# Patient Record
Sex: Female | Born: 1995 | Race: White | Hispanic: No
Health system: Southern US, Community
[De-identification: ages and names within clinical notes are randomized; demographics above are authoritative.]

## PROBLEM LIST (undated history)

## (undated) ENCOUNTER — Inpatient Hospital Stay (HOSPITAL_COMMUNITY): Payer: Self-pay

## (undated) DIAGNOSIS — B1920 Unspecified viral hepatitis C without hepatic coma: Secondary | ICD-10-CM

## (undated) DIAGNOSIS — F1190 Opioid use, unspecified, uncomplicated: Secondary | ICD-10-CM

---
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
## 2016-08-31 ENCOUNTER — Encounter (HOSPITAL_BASED_OUTPATIENT_CLINIC_OR_DEPARTMENT_OTHER): Payer: Self-pay | Admitting: *Deleted

## 2016-08-31 ENCOUNTER — Emergency Department (HOSPITAL_BASED_OUTPATIENT_CLINIC_OR_DEPARTMENT_OTHER)
Admission: EM | Admit: 2016-08-31 | Discharge: 2016-08-31 | Discharge status: Against Medical Advice | Payer: Medicaid Other | Attending: Emergency Medicine | Admitting: Emergency Medicine

## 2016-08-31 DIAGNOSIS — Z5329 Procedure and treatment not carried out because of patient's decision for other reasons: Secondary | ICD-10-CM | POA: Insufficient documentation

## 2016-08-31 DIAGNOSIS — L02414 Cutaneous abscess of left upper limb: Secondary | ICD-10-CM | POA: Insufficient documentation

## 2016-08-31 DIAGNOSIS — L03113 Cellulitis of right upper limb: Secondary | ICD-10-CM | POA: Insufficient documentation

## 2016-08-31 DIAGNOSIS — F1990 Other psychoactive substance use, unspecified, uncomplicated: Secondary | ICD-10-CM

## 2016-08-31 NOTE — ED Notes (Signed)
Patient claimed that she has been a heroine addict for 6 years but she started going to Methadone clinic for 2 days.   She's been on 35 mg of Liquid Methadone.    She stated that she was taking Bactrim a week ago but only took 2 tabs out of the prescription.  She left her med at her friend's home and he refused to give it to her.  She has also been taking Clindamycin, her mom's med.

## 2016-08-31 NOTE — ED Notes (Signed)
ED Provider at bedside. 

## 2016-08-31 NOTE — ED Provider Notes (Signed)
[facility] Provider Note   CSN: [id number] Arrival date & time: 08/31/16  2201  By signing my name below, I, Rosana Fret, attest that this documentation has been prepared under the direction and in the presence of Arthor Captain, PA-C.  Electronically Signed: Rosana Fret, ED Scribe. 08/31/16. 11:51 PM.  History   Chief Complaint Chief Complaint  Patient presents with  . Abscess   The history is provided by the patient. No language interpreter was used.   HPI Comments: Rita Trujillo is a 21 y.o. female who presents to the Emergency Department complaining of a moderate, gradually worsening area of pain and swelling to the left forearm onset 3-4 days ago. Pt has a hx of similar symptoms and started a course of Bactrim for 2 days. She stopped taking it for a week and just took Clindamycin today. Pt states pain is exacerbated with palpation and direct pressure. Pt has a hx of IV drug use. Her last use was 48 hours ago and she went to a rehab clinic today. Denies fever, chills, drainage from the area.   Past Medical History:  Diagnosis Date  . Hepatitis C     Patient Active Problem List   Diagnosis Date Noted  . NSVD (normal spontaneous vaginal delivery) 04/20/2014  . Active labor 04/19/2014  . Heroin dependence (HCC) 10/15/2012    History reviewed. No pertinent surgical history.  OB History    Gravida Para Term Preterm AB Living   1 1 0 1 0 1   SAB TAB Ectopic Multiple Live Births   0 0 0 0 1       Home Medications    Prior to Admission medications   Not on File    Family History History reviewed. No pertinent family history.  Social History Social History  Substance Use Topics  . Smoking status: Current Every Day Smoker  . Smokeless tobacco: Not on file  . Alcohol use No     Allergies   Patient has no known allergies.   Review of Systems Review of Systems All other systems reviewed and are negative for acute change except as noted in  the HPI.  Physical Exam Updated Vital Signs BP 107/71 (BP Location: Right Arm)   Pulse 95   Temp 99.1 F (37.3 C) (Oral)   Resp 18   Ht 5\' 5"  (1.651 m)   Wt 54.4 kg (120 lb)   LMP 07/11/2016   SpO2 98%   BMI 19.97 kg/m   Physical Exam  Constitutional: She is oriented to person, place, and time. She appears well-developed and well-nourished. No distress.  HENT:  Head: Normocephalic and atraumatic.  Eyes: EOM are normal.  Neck: Normal range of motion.  Cardiovascular: Normal rate, regular rhythm, normal heart sounds and intact distal pulses.  Exam reveals no gallop and no friction rub.   No murmur heard. Pulmonary/Chest: Effort normal and breath sounds normal.  Abdominal: Soft. She exhibits no distension. There is no tenderness.  Musculoskeletal: Normal range of motion.  Neurological: She is alert and oriented to person, place, and time.  Skin: Skin is warm and dry. There is erythema.  Left forearm 4 cm abscess area of fluctuance with crusting. 20 cm of surrounding erythema.   Psychiatric: She has a normal mood and affect. Judgment normal.  Nursing note and vitals reviewed.    ED Treatments / Results  DIAGNOSTIC STUDIES: Oxygen Saturation is 98% on RA, normal by my interpretation.   COORDINATION OF CARE: 10:42 PM-Discussed  next steps with pt including I&D. Pt verbalized understanding and is agreeable with the plan.   Labs (all labs ordered are listed, but only abnormal results are displayed) Labs Reviewed - No data to display  EKG  EKG Interpretation None       Radiology No results found.  Procedures Procedures (including critical care time)  Medications Ordered in ED Medications - No data to display   Initial Impression / Assessment and Plan / ED Course  I have reviewed the triage vital signs and the nursing notes.  Pertinent labs & imaging results that were available during my care of the patient were reviewed by me and considered in my medical  decision making (see chart for details).        I discussed plan of care with the patient which included incision and drainage. The patient eloped prior to intervention. I was not able to discuss risks of leaving AMA.  Final Clinical Impressions(s) / ED Diagnoses   Final diagnoses:  Abscess of left forearm  Right forearm cellulitis  IVDU (intravenous drug user)    New Prescriptions There are no discharge medications for this patient.  I personally performed the services described in this documentation, which was scribed in my presence. The recorded information has been reviewed and is accurate.    Arthor CaptainHarris, Elisabet Gutzmer, PA-C 09/02/16 2136    Maia PlanLong, Joshua G, MD 09/03/16 [phone]

## 2016-08-31 NOTE — ED Notes (Signed)
Patient stated that she has to go out to smoke.  Informed her that this is a smoke free facility but she insisted.  Her mother went out with her.

## 2016-08-31 NOTE — ED Notes (Signed)
Pt not found in room , [facility] scanned parking lot and no pt found

## 2016-08-31 NOTE — ED Triage Notes (Signed)
Abscess on left arm.  IV drug user, hx of same.

## 2020-02-27 NOTE — L&D Delivery Note (Signed)
OB/GYN Faculty Practice Delivery Note  Rita Trujillo is a 25 y.o. G2P0101 s/p NSVD at [redacted]w[redacted]d. She was admitted for active labor.   ROM: unknown GBS Status: negative   Maximum Maternal Temperature: 98.7 F   Labor Progress: Patient arrived to the MAU at 9 cm dilation and rapidly progressed to complete and pushing.   Delivery Date/Time: 12/08/2020 at 2042 Delivery: Patient arrived to MAU 9 cm dilated and I accompanied her up to L&D. On arrival to the room patient was pushing involuntarily and fetal heart rate had dropped into the 70's. After several pushes head delivered in ROA position with compound hand presentation. No nuchal cord present. Shoulder and body delivered in usual fashion. Infant with spontaneous cry, placed on mother's abdomen, dried and stimulated. Cord clamped x 2 after 1-minute delay, and cut by FOB. Cord blood drawn. At this point [facility] CNM Holshouser arrived and assumed care of the patient.   Infant: Female  APGAR (1 MIN): 8   APGAR (5 MINS): 9    Weight: pending  Venora Maples, MD/MPH Attending Family Medicine Physician, Faculty Practice [facility], [facility]

## 2020-02-27 NOTE — L&D Delivery Note (Signed)
Delivery Note Labor onset: 12/08/2020  Labor Onset Time: 2000 Complete dilation at 8:30 PM  Onset of pushing at 2040 FHR second stage Cat 2 Analgesia/Anesthesia intrapartum: N/A  Guided pushing with maternal urge. Delivery of a viable female at 2042. Fetal head delivered in ROA position. Delivered by MAU faculty practice before I arrived to room.   Nuchal cord: N/A.  Infant placed on maternal abd, dried, and tactile stim.  Cord double clamped after 1 minute and cut by FOB.  3 Rns present for birth.  Cord blood sample collected: yes Arterial cord blood sample collected: N/A  Placenta delivered Tomasa Blase via Binnie Kand Maneuver, intact, with 3 VC.  Placenta to L&D. Uterine tone firm, bleeding minimal  Right labial laceration identified.  Anesthesia: lidocaine 1% Repair: 3-0 vicryl  QBL/EBL (mL): 100 Complications: N/A APGAR: APGAR (1 MIN): 8   APGAR (5 MINS): 9   APGAR (10 MINS):   Mom to postpartum.  Baby to Couplet care / Skin to Skin.  Gerhard Munch Pat Elicker MSN, CNM 12/08/2020, 9:46 PM

## 2020-06-09 LAB — OB RESULTS CONSOLE HIV ANTIBODY (ROUTINE TESTING): HIV: NONREACTIVE

## 2020-06-09 LAB — OB RESULTS CONSOLE RPR: RPR: NONREACTIVE

## 2020-06-09 LAB — OB RESULTS CONSOLE HEPATITIS B SURFACE ANTIGEN: Hepatitis B Surface Ag: NEGATIVE

## 2020-06-09 LAB — OB RESULTS CONSOLE RUBELLA ANTIBODY, IGM: Rubella: IMMUNE

## 2020-06-16 DIAGNOSIS — B182 Chronic viral hepatitis C: Secondary | ICD-10-CM | POA: Insufficient documentation

## 2020-06-18 ENCOUNTER — Emergency Department (HOSPITAL_COMMUNITY)
Admission: EM | Admit: 2020-06-18 | Discharge: 2020-06-18 | Disposition: A | Discharge status: Home / Self Care | Payer: Medicaid Other | Attending: Emergency Medicine | Admitting: Emergency Medicine

## 2020-06-18 ENCOUNTER — Encounter (HOSPITAL_COMMUNITY): Payer: Self-pay | Admitting: Obstetrics and Gynecology

## 2020-06-18 ENCOUNTER — Other Ambulatory Visit: Payer: Self-pay

## 2020-06-18 ENCOUNTER — Inpatient Hospital Stay (HOSPITAL_COMMUNITY)
Admission: AD | Admit: 2020-06-18 | Discharge: 2020-06-18 | Disposition: A | Discharge status: Home / Self Care | Payer: Medicaid Other | Attending: Obstetrics and Gynecology | Admitting: Obstetrics and Gynecology

## 2020-06-18 DIAGNOSIS — F1990 Other psychoactive substance use, unspecified, uncomplicated: Secondary | ICD-10-CM

## 2020-06-18 DIAGNOSIS — Z3A14 14 weeks gestation of pregnancy: Secondary | ICD-10-CM | POA: Diagnosis not present

## 2020-06-18 DIAGNOSIS — F17200 Nicotine dependence, unspecified, uncomplicated: Secondary | ICD-10-CM | POA: Insufficient documentation

## 2020-06-18 DIAGNOSIS — O99322 Drug use complicating pregnancy, second trimester: Secondary | ICD-10-CM | POA: Diagnosis present

## 2020-06-18 DIAGNOSIS — Z79899 Other long term (current) drug therapy: Secondary | ICD-10-CM

## 2020-06-18 DIAGNOSIS — F1120 Opioid dependence, uncomplicated: Secondary | ICD-10-CM

## 2020-06-18 DIAGNOSIS — Z3492 Encounter for supervision of normal pregnancy, unspecified, second trimester: Secondary | ICD-10-CM

## 2020-06-18 DIAGNOSIS — F1999 Other psychoactive substance use, unspecified with unspecified psychoactive substance-induced disorder: Secondary | ICD-10-CM | POA: Insufficient documentation

## 2020-06-18 MED ORDER — METHADONE HCL 10 MG PO TABS
60.0000 mg | ORAL_TABLET | Freq: Once | ORAL | Status: AC
Start: 2020-06-18 — End: 2020-06-18
  Administered 2020-06-18: 60 mg via ORAL
  Filled 2020-06-18: qty 6

## 2020-06-18 NOTE — ED Provider Notes (Addendum)
[facility] EMERGENCY DEPARTMENT Provider Note   CSN: [id number] Arrival date & time: 06/18/20  0930     History Chief complaint: Missed methadone dose  Rita Trujillo is a 25 y.o. female.  HPI    The patient is on chronic methadone for history of heroin dependence.  Patient is also currently [redacted] weeks pregnant.  Patient states she is new to the methadone treatment center that she is using now.  She thought they closed at 830 this morning but they closed at 730.  She states she arrived late and they had already closed shut down.  They instructed her to go to [facility] emergency room where she could potentially receive a courtesy dose.  Patient denies any other complaints. Patient went to the maternity admission unit this morning.  They instructed the patient to come down to the emergency department   Past Medical History:  Diagnosis Date  . Hepatitis C     Patient Active Problem List   Diagnosis Date Noted  . NSVD (normal spontaneous vaginal delivery) 04/20/2014  . Active labor 04/19/2014  . Heroin dependence (HCC) 10/15/2012    No past surgical history on file.   OB History    Gravida  2   Para  1   Term  0   Preterm  1   AB  0   Living  1     SAB  0   IAB  0   Ectopic  0   Multiple  0   Live Births  1           No family history on file.  Social History   Tobacco Use  . Smoking status: Current Every Day Smoker  Substance Use Topics  . Alcohol use: No  . Drug use: Yes    Types: IV, Cocaine    Comment: heroin, cocaine    Home Medications Prior to Admission medications   Not on File    Allergies    Amoxicillin and Penicillins  Review of Systems   Review of Systems  All other systems reviewed and are negative.   Physical Exam Updated Vital Signs BP 125/68 (BP Location: Right Arm)   Pulse 78   Temp 98 F (36.7 C) (Oral)   Resp 16   SpO2 99%   Physical Exam Vitals and nursing note reviewed.   Constitutional:      General: She is not in acute distress.    Appearance: She is well-developed.  HENT:     Head: Normocephalic and atraumatic.     Right Ear: External ear normal.     Left Ear: External ear normal.  Eyes:     General: No scleral icterus.       Right eye: No discharge.        Left eye: No discharge.     Conjunctiva/sclera: Conjunctivae normal.  Neck:     Trachea: No tracheal deviation.  Cardiovascular:     Rate and Rhythm: Normal rate.  Pulmonary:     Effort: Pulmonary effort is normal. No respiratory distress.     Breath sounds: No stridor.  Abdominal:     General: There is no distension.  Musculoskeletal:        General: No swelling or deformity.     Cervical back: Neck supple.  Skin:    General: Skin is warm and dry.     Findings: No rash.  Neurological:     Mental Status: She is alert.  Cranial Nerves: Cranial nerve deficit: no gross deficits.     ED Results / Procedures / Treatments   Labs (all labs ordered are listed, but only abnormal results are displayed) Labs Reviewed - No data to display  EKG None  Radiology No results found.  Procedures Procedures   Medications Ordered in ED Medications  methadone (DOLOPHINE) tablet 60 mg (has no administration in time range)    ED Course  I have reviewed the triage vital signs and the nursing notes.  Pertinent labs & imaging results that were available during my care of the patient were reviewed by me and considered in my medical decision making (see chart for details).  Clinical Course as of 06/18/20 1016  Sat Jun 18, 2020  0947 Spoke with [facility] tx center.  Pt is a confirmed patient.   [JK]    Clinical Course User Index [JK] Linwood Dibbles, MD   MDM Rules/Calculators/A&P                          Patient is a confirmed patient at a methadone treatment center.  Patient states she misunderstood the time that she had to be there by and was late.  I spoke to the treatment center  and they did confirm that the patient did arrive today.  She unfortunately was there passed and there confirmed hours.  Patient is normally on the weekends if would be given 1 dose of her methadone medication while she was there and they would give her 1 dose to take on Sunday.  Patient's confirm dose is 60 mg.  That is what she received on April 22.  Patient will be given 1 dose of methadone here.  I do think it is reasonable for her to return tomorrow for second dose Final Clinical Impression(s) / ED Diagnoses Final diagnoses:  Medication management    Rx / DC Orders ED Discharge Orders    None       Linwood Dibbles, MD 06/18/20 1016  PE addendum    Linwood Dibbles, MD 06/27/20 1140

## 2020-06-18 NOTE — Progress Notes (Signed)
Report called to ED Charge RN regarding pt transfer.  Accepting MD Gwenlyn Fudge.

## 2020-06-18 NOTE — ED Triage Notes (Signed)
Pt normally goes to [facility] tx center, but did not make it by the time they closed but was advised that if she came to the ED we could courtesy dose her for today and tomorrow while they are closed since she is [redacted] weeks pregnant.

## 2020-06-18 NOTE — MAU Note (Signed)
Presents to receive Methadone treattment. Denies abdominal pain/cramping and VB.

## 2020-06-18 NOTE — Discharge Instructions (Signed)

## 2020-06-18 NOTE — MAU Provider Note (Signed)
Event Date/Time   First Provider Initiated Contact with Patient 06/18/20 0915      S Ms. Rita Trujillo is a 25 y.o. G2P0101 patient who presents to MAU today requesting her methadone treatment. She missed her am dose and the clinic is closed tomorrow. She was told by clinic staff if she came to the hospital, we will treat her. She denies any abdominal pain, vaginal bleeding or leaking of fluid.    O BP (!) 116/55 (BP Location: Right Arm)   Pulse 80   Temp 98.2 F (36.8 C) (Oral)   Resp 18   Ht 5\' 5"  (1.651 m)   Wt 70.6 kg   SpO2 99%   BMI 25.89 kg/m  Physical Exam Vitals and nursing note reviewed.  Constitutional:      General: She is not in acute distress.    Appearance: She is well-developed.  HENT:     Head: Normocephalic.  Cardiovascular:     Rate and Rhythm: Normal rate.  Pulmonary:     Effort: Pulmonary effort is normal. No respiratory distress.  Skin:    General: Skin is warm and dry.  Neurological:     Mental Status: She is alert and oriented to person, place, and time.  Psychiatric:        Behavior: Behavior normal.        Thought Content: Thought content normal.        Judgment: Judgment normal.    FHT: 159 bpm  Patient without OB complaints, normal FHT noted.   CNM for [facility] consulted- states OB office did not inform patient to come to MAU for treatment and will follow MAU protocol for this.  Dr. in agreement that MAU does not offer methadone treatment  Dr. Donavan Foil at [facility] consulted- states if pharmacy has option to dispense, the ED will give there. Main pharmacy confirms ability to dispense medication. Patient to be transferred to [facility] to receive treatment. Patient verbalized understanding.    A Medical screening exam complete 1. Normal intrauterine pregnancy on prenatal ultrasound in second trimester   2. [redacted] weeks gestation of pregnancy   3. Methadone maintenance treatment affecting pregnancy in second trimester (HCC)      P Discharge  from MAU in stable condition List of options for follow-up given  Warning signs for worsening condition that would warrant emergency follow-up discussed Patient may return to MAU as needed   [facility], Rolm Bookbinder 06/18/2020 9:15 AM

## 2020-06-19 ENCOUNTER — Encounter (HOSPITAL_COMMUNITY): Payer: Self-pay | Admitting: Emergency Medicine

## 2020-06-19 ENCOUNTER — Emergency Department (HOSPITAL_COMMUNITY)
Admission: EM | Admit: 2020-06-19 | Discharge: 2020-06-19 | Disposition: A | Discharge status: Home / Self Care | Payer: Medicaid Other | Attending: Emergency Medicine | Admitting: Emergency Medicine

## 2020-06-19 DIAGNOSIS — Z5181 Encounter for therapeutic drug level monitoring: Secondary | ICD-10-CM | POA: Insufficient documentation

## 2020-06-19 DIAGNOSIS — Z3A14 14 weeks gestation of pregnancy: Secondary | ICD-10-CM | POA: Diagnosis not present

## 2020-06-19 DIAGNOSIS — Z79899 Other long term (current) drug therapy: Secondary | ICD-10-CM

## 2020-06-19 DIAGNOSIS — F17200 Nicotine dependence, unspecified, uncomplicated: Secondary | ICD-10-CM | POA: Insufficient documentation

## 2020-06-19 DIAGNOSIS — O99322 Drug use complicating pregnancy, second trimester: Secondary | ICD-10-CM | POA: Diagnosis not present

## 2020-06-19 DIAGNOSIS — F1990 Other psychoactive substance use, unspecified, uncomplicated: Secondary | ICD-10-CM | POA: Diagnosis not present

## 2020-06-19 DIAGNOSIS — F1120 Opioid dependence, uncomplicated: Secondary | ICD-10-CM | POA: Diagnosis not present

## 2020-06-19 MED ORDER — METHADONE HCL 10 MG/ML PO CONC
60.0000 mg | Freq: Once | ORAL | Status: AC
  Administered 2020-06-19: 60 mg via ORAL
  Filled 2020-06-19 (×2): qty 6

## 2020-06-19 NOTE — ED Provider Notes (Signed)
[facility] EMERGENCY DEPARTMENT Provider Note   CSN: [id number] Arrival date & time: 06/19/20  1121     History Chief Complaint  Patient presents with  . Methadone    Rita Trujillo is a 25 y.o. female.  HPI   Patient has a history of heroin use and is currently on chronic methadone treatment.  Patient is [redacted] weeks pregnant.  Patient was seen by me yesterday as well.  She went to a new treatment center yesterday morning.  She thought the treatment center close at 830 but they closed at 730.  Patient ended up arriving late and she was not able to receive her dose.  Apparently staff are still there but were not administering medications.  They told her to come to the emergency room for a courtesy dose of methadone.  I did speak with the methadone treatment center yesterday and they confirmed that the patient is a patient of theirs.  They corroborated all the details that she provided.  They indicated she received 60 mg daily and normally on Saturday she would have been given an additional dose to cover her on Sunday.  Patient denies any changes from yesterday.  She is feeling well.  She is requesting her Sunday dose medication.  Past Medical History:  Diagnosis Date  . Hepatitis C     Patient Active Problem List   Diagnosis Date Noted  . NSVD (normal spontaneous vaginal delivery) 04/20/2014  . Active labor 04/19/2014  . Heroin dependence (HCC) 10/15/2012    History reviewed. No pertinent surgical history.   OB History    Gravida  2   Para  1   Term  0   Preterm  1   AB  0   Living  1     SAB  0   IAB  0   Ectopic  0   Multiple  0   Live Births  1           No family history on file.  Social History   Tobacco Use  . Smoking status: Current Every Day Smoker  Substance Use Topics  . Alcohol use: No  . Drug use: Yes    Types: IV, Cocaine    Comment: heroin, cocaine    Home Medications Prior to Admission medications   Not on  File    Allergies    Amoxicillin and Penicillins  Review of Systems   Review of Systems  All other systems reviewed and are negative.   Physical Exam Updated Vital Signs BP 121/66 (BP Location: Right Arm)   Pulse (!) 101   Temp 97.8 F (36.6 C) (Oral)   Resp 18   SpO2 99%   Physical Exam Vitals and nursing note reviewed.  Constitutional:      General: She is not in acute distress.    Appearance: She is well-developed.  HENT:     Head: Normocephalic and atraumatic.     Right Ear: External ear normal.     Left Ear: External ear normal.  Eyes:     General: No scleral icterus.       Right eye: No discharge.        Left eye: No discharge.     Conjunctiva/sclera: Conjunctivae normal.  Neck:     Trachea: No tracheal deviation.  Cardiovascular:     Rate and Rhythm: Normal rate.  Pulmonary:     Effort: Pulmonary effort is normal. No respiratory distress.  Breath sounds: No stridor.  Abdominal:     General: There is no distension.  Musculoskeletal:        General: No swelling or deformity.     Cervical back: Neck supple.  Skin:    General: Skin is warm and dry.     Findings: No rash.  Neurological:     Mental Status: She is alert.     Cranial Nerves: Cranial nerve deficit: no gross deficits.     ED Results / Procedures / Treatments   Labs (all labs ordered are listed, but only abnormal results are displayed) Labs Reviewed - No data to display  EKG None  Radiology No results found.  Procedures Procedures   Medications Ordered in ED Medications  methadone (DOLOPHINE) 10 MG/ML solution 60 mg (has no administration in time range)    ED Course  I have reviewed the triage vital signs and the nursing notes.  Pertinent labs & imaging results that were available during my care of the patient were reviewed by me and considered in my medical decision making (see chart for details).    MDM Rules/Calculators/A&P                          Patient will be  given methadone.  Explained to the patient that we do not do this typically in the emergency department and likely will not be able to in the future.  Patient understands.  She will follow-up with her methadone clinic tomorrow Final Clinical Impression(s) / ED Diagnoses Final diagnoses:  Medication management    Rx / DC Orders ED Discharge Orders    None       Linwood Dibbles, MD 06/19/20 1147

## 2020-06-19 NOTE — Discharge Instructions (Addendum)
Follow up at your treatment center as planned

## 2020-06-19 NOTE — ED Triage Notes (Addendum)
Pt states she came to ED yesterday for a "courtesy dose" of methadone and was told she could return today to receive another dose.  [redacted] weeks pregnant.

## 2020-06-23 ENCOUNTER — Other Ambulatory Visit: Payer: Medicaid Other

## 2020-06-23 ENCOUNTER — Ambulatory Visit: Payer: Medicaid Other

## 2020-06-23 ENCOUNTER — Other Ambulatory Visit: Payer: Self-pay | Admitting: Obstetrics

## 2020-06-23 DIAGNOSIS — Z363 Encounter for antenatal screening for malformations: Secondary | ICD-10-CM

## 2020-08-09 ENCOUNTER — Ambulatory Visit: Payer: Medicaid Other

## 2020-08-09 ENCOUNTER — Other Ambulatory Visit: Payer: Medicaid Other

## 2020-08-09 ENCOUNTER — Ambulatory Visit: Payer: Medicaid Other | Attending: Obstetrics and Gynecology

## 2020-10-24 ENCOUNTER — Other Ambulatory Visit: Payer: Self-pay

## 2020-11-10 ENCOUNTER — Encounter (HOSPITAL_COMMUNITY): Payer: Self-pay | Admitting: Emergency Medicine

## 2020-11-10 ENCOUNTER — Ambulatory Visit: Payer: Medicaid Other | Attending: Obstetrics and Gynecology

## 2020-11-10 ENCOUNTER — Other Ambulatory Visit: Payer: Self-pay

## 2020-11-10 ENCOUNTER — Ambulatory Visit (HOSPITAL_BASED_OUTPATIENT_CLINIC_OR_DEPARTMENT_OTHER): Payer: Medicaid Other | Admitting: Obstetrics

## 2020-11-10 ENCOUNTER — Ambulatory Visit: Payer: Medicaid Other | Admitting: *Deleted

## 2020-11-10 ENCOUNTER — Other Ambulatory Visit: Payer: Self-pay | Admitting: *Deleted

## 2020-11-10 VITALS — BP 131/83 | HR 71

## 2020-11-10 DIAGNOSIS — F1910 Other psychoactive substance abuse, uncomplicated: Secondary | ICD-10-CM | POA: Insufficient documentation

## 2020-11-10 DIAGNOSIS — O98419 Viral hepatitis complicating pregnancy, unspecified trimester: Secondary | ICD-10-CM | POA: Diagnosis not present

## 2020-11-10 DIAGNOSIS — Z3A35 35 weeks gestation of pregnancy: Secondary | ICD-10-CM

## 2020-11-10 DIAGNOSIS — B182 Chronic viral hepatitis C: Secondary | ICD-10-CM | POA: Diagnosis not present

## 2020-11-10 DIAGNOSIS — O09293 Supervision of pregnancy with other poor reproductive or obstetric history, third trimester: Secondary | ICD-10-CM | POA: Diagnosis not present

## 2020-11-10 DIAGNOSIS — O99323 Drug use complicating pregnancy, third trimester: Secondary | ICD-10-CM | POA: Insufficient documentation

## 2020-11-10 DIAGNOSIS — O98413 Viral hepatitis complicating pregnancy, third trimester: Secondary | ICD-10-CM

## 2020-11-10 DIAGNOSIS — Z3689 Encounter for other specified antenatal screening: Secondary | ICD-10-CM

## 2020-11-10 DIAGNOSIS — Z363 Encounter for antenatal screening for malformations: Secondary | ICD-10-CM | POA: Diagnosis not present

## 2020-11-10 DIAGNOSIS — O99333 Smoking (tobacco) complicating pregnancy, third trimester: Secondary | ICD-10-CM

## 2020-11-11 NOTE — Progress Notes (Signed)
MFM Note  Rita Trujillo was seen for a detailed fetal anatomy scan due to a history of polysubstance abuse and chronic hepatitis C.  She is currently treated with methadone 180 mg daily.  She reports that she has had hepatitis C since about the age 25.  She most likely acquired hepatitis C from injection of illicit drugs.  She denies any other significant past medical history and denies any problems in her current pregnancy.    She had a cell free DNA test earlier in her pregnancy which indicated a low risk for trisomy 67, 25, and 13. A female fetus is predicted.   She was informed that the fetal growth and amniotic fluid level were appropriate for her gestational age.   The views of the fetal anatomy were limited today due to her advanced gestational age.  However, there were no obvious fetal anomalies noted today.  The patient was informed that anomalies may be missed due to technical limitations. If the fetus is in a suboptimal position or maternal habitus is increased, visualization of the fetus in the maternal uterus may be impaired.  The following were discussed with the patient today:  Hepatitis C and pregnancy  HCV can be transmitted from an infected mother to the child during both pregnancy and childbirth.  HCV-infected mothers transmit their infection to their baby in 5.8% of pregnancies; the risk of transmission is higher if the mother is co-infected with HIV.  Her HIV test was negative earlier in pregnancy.  More than half of new hepatitis C infections progress to chronic HCV infection. Without treatment, approximately 15-20% of people living with chronic HCV infection will develop progressive liver fibrosis and cirrhosis.    Over 90% of people infected with HCV can be cured with 8-12 weeks of oral therapy.    According to the CDC, Hepatitis C curative treatment is not currently approved for use during pregnancy; however, once the mother has given birth and completed breastfeeding,  it is safe to begin this treatment.  She was advised to seek treatment with an infectious disease specialist once she has delivered and has completed breast-feeding.  Vaginal delivery has not been shown to be a risk factor for vertical transmission of HCV.  She may attempt a vaginal delivery.  A cesarean delivery should be reserved for obstetrical indications.  During the labor process, internal fetal monitors should be avoided to decrease the risk of vertical transmission of HCV.  The pediatricians should be notified regarding the mother's hepatitis C infection after delivery so that the baby can be monitored and tested.   Breast-feeding does not seem to affect the risk of vertical transmission of HCV.  Therefore, she may breast-feed the baby unless her nipples are cracked or bleeding.  Methadone replacement therapy in pregnancy  The implications and management of methadone treatment in pregnancy was discussed in detail today.    She was advised that her baby may require a prolonged hospitalization to be weaned off opioids after delivery.    Due to maternal treatment with methadone, we will continue to follow her with weekly fetal testing until delivery.   A BPP was scheduled in 1 week.    The patient stated that all of her questions have been answered.  A total of 30 minutes was spent counseling and coordinating the care for this patient.  Greater than 50% of the time was spent in direct face-to-face contact.  Recommendations:  She may attempt a vaginal delivery Avoid placing an internal fetal  scalp electrode during her delivery She may breast-feed after delivery She should see an infectious disease specialist after delivery to be placed on medications that may cure hepatitis C Notify the pediatricians regarding her her hepatitis C status and methadone treatment

## 2020-11-16 ENCOUNTER — Ambulatory Visit: Payer: Medicaid Other

## 2020-11-18 ENCOUNTER — Ambulatory Visit: Payer: Medicaid Other

## 2020-11-22 LAB — OB RESULTS CONSOLE GBS: GBS: NEGATIVE

## 2020-11-23 ENCOUNTER — Ambulatory Visit (HOSPITAL_BASED_OUTPATIENT_CLINIC_OR_DEPARTMENT_OTHER): Payer: Medicaid Other | Admitting: *Deleted

## 2020-11-23 ENCOUNTER — Other Ambulatory Visit: Payer: Self-pay | Admitting: Obstetrics

## 2020-11-23 ENCOUNTER — Encounter: Payer: Self-pay | Admitting: *Deleted

## 2020-11-23 ENCOUNTER — Ambulatory Visit: Payer: Medicaid Other | Admitting: *Deleted

## 2020-11-23 ENCOUNTER — Ambulatory Visit: Payer: Medicaid Other | Attending: Obstetrics

## 2020-11-23 ENCOUNTER — Other Ambulatory Visit: Payer: Self-pay

## 2020-11-23 VITALS — BP 122/81 | HR 56

## 2020-11-23 DIAGNOSIS — O99320 Drug use complicating pregnancy, unspecified trimester: Secondary | ICD-10-CM | POA: Insufficient documentation

## 2020-11-23 DIAGNOSIS — O98419 Viral hepatitis complicating pregnancy, unspecified trimester: Secondary | ICD-10-CM | POA: Insufficient documentation

## 2020-11-23 DIAGNOSIS — F1910 Other psychoactive substance abuse, uncomplicated: Secondary | ICD-10-CM | POA: Diagnosis not present

## 2020-11-23 DIAGNOSIS — O98413 Viral hepatitis complicating pregnancy, third trimester: Secondary | ICD-10-CM | POA: Diagnosis not present

## 2020-11-23 DIAGNOSIS — O99333 Smoking (tobacco) complicating pregnancy, third trimester: Secondary | ICD-10-CM | POA: Diagnosis not present

## 2020-11-23 DIAGNOSIS — O283 Abnormal ultrasonic finding on antenatal screening of mother: Secondary | ICD-10-CM

## 2020-11-23 DIAGNOSIS — O99323 Drug use complicating pregnancy, third trimester: Secondary | ICD-10-CM | POA: Diagnosis not present

## 2020-11-23 DIAGNOSIS — Z3A37 37 weeks gestation of pregnancy: Secondary | ICD-10-CM

## 2020-11-23 DIAGNOSIS — B182 Chronic viral hepatitis C: Secondary | ICD-10-CM | POA: Insufficient documentation

## 2020-11-23 NOTE — Procedures (Signed)
Rita Trujillo 1995/12/20 [redacted]w[redacted]d  Fetus A Non-Stress Test Interpretation for 11/23/20  Indication: Unsatisfactory BPP  Fetal Heart Rate A Mode: External Baseline Rate (A): 120 bpm Variability: Moderate Accelerations: 15 x 15 Decelerations: None Multiple birth?: No  Uterine Activity Mode: Palpation, Toco Contraction Frequency (min): 2-8 Contraction Duration (sec): 50-90 Contraction Quality: Mild Resting Tone Palpated: Relaxed Resting Time: Adequate  Interpretation (Fetal Testing) Nonstress Test Interpretation: Reactive Overall Impression: Reassuring for gestational age Comments: Dr. Judeth Cornfield reviewed tracing

## 2020-11-28 LAB — OB RESULTS CONSOLE GC/CHLAMYDIA
Chlamydia: NEGATIVE
Gonorrhea: NEGATIVE

## 2020-11-29 ENCOUNTER — Telehealth: Payer: Self-pay

## 2020-12-01 ENCOUNTER — Encounter: Payer: Self-pay | Admitting: *Deleted

## 2020-12-01 ENCOUNTER — Ambulatory Visit: Payer: Medicaid Other

## 2020-12-01 ENCOUNTER — Ambulatory Visit (HOSPITAL_BASED_OUTPATIENT_CLINIC_OR_DEPARTMENT_OTHER): Payer: Medicaid Other | Admitting: *Deleted

## 2020-12-01 ENCOUNTER — Ambulatory Visit: Payer: Medicaid Other | Attending: Obstetrics | Admitting: *Deleted

## 2020-12-01 ENCOUNTER — Other Ambulatory Visit: Payer: Self-pay

## 2020-12-01 VITALS — BP 138/85 | HR 68

## 2020-12-01 DIAGNOSIS — O99323 Drug use complicating pregnancy, third trimester: Secondary | ICD-10-CM

## 2020-12-01 DIAGNOSIS — O98413 Viral hepatitis complicating pregnancy, third trimester: Secondary | ICD-10-CM

## 2020-12-01 DIAGNOSIS — Z3A38 38 weeks gestation of pregnancy: Secondary | ICD-10-CM | POA: Insufficient documentation

## 2020-12-01 DIAGNOSIS — F1120 Opioid dependence, uncomplicated: Secondary | ICD-10-CM

## 2020-12-01 DIAGNOSIS — B182 Chronic viral hepatitis C: Secondary | ICD-10-CM | POA: Diagnosis not present

## 2020-12-01 NOTE — Procedures (Signed)
FAE BLOSSOM 07/05/1995 [redacted]w[redacted]d  Fetus A Non-Stress Test Interpretation for 12/01/20  Indication:  Opioid dependence  Fetal Heart Rate A Mode: External Baseline Rate (A): 110 bpm Variability: Moderate Accelerations: 15 x 15 Decelerations: None Multiple birth?: No  Uterine Activity Mode: Palpation, Toco Contraction Frequency (min): 4 Contraction Duration (sec): 60-100 Contraction Quality: Mild Resting Tone Palpated: Relaxed Resting Time: Adequate  Interpretation (Fetal Testing) Nonstress Test Interpretation: Reactive Comments: Dr. Judeth Cornfield reviewed tracing.

## 2020-12-08 ENCOUNTER — Encounter (HOSPITAL_COMMUNITY): Payer: Self-pay | Admitting: Obstetrics and Gynecology

## 2020-12-08 ENCOUNTER — Encounter: Payer: Self-pay | Admitting: *Deleted

## 2020-12-08 ENCOUNTER — Inpatient Hospital Stay (HOSPITAL_COMMUNITY)
Admission: AD | Admit: 2020-12-08 | Discharge: 2020-12-10 | DRG: 806 | Disposition: A | Discharge status: Home / Self Care | Payer: Medicaid Other | Attending: Obstetrics & Gynecology | Admitting: Obstetrics & Gynecology

## 2020-12-08 ENCOUNTER — Ambulatory Visit: Payer: Medicaid Other | Admitting: *Deleted

## 2020-12-08 ENCOUNTER — Ambulatory Visit (HOSPITAL_BASED_OUTPATIENT_CLINIC_OR_DEPARTMENT_OTHER): Payer: Medicaid Other

## 2020-12-08 ENCOUNTER — Other Ambulatory Visit: Payer: Self-pay

## 2020-12-08 VITALS — BP 128/75 | HR 74

## 2020-12-08 DIAGNOSIS — O98419 Viral hepatitis complicating pregnancy, unspecified trimester: Secondary | ICD-10-CM | POA: Insufficient documentation

## 2020-12-08 DIAGNOSIS — O99334 Smoking (tobacco) complicating childbirth: Secondary | ICD-10-CM | POA: Diagnosis present

## 2020-12-08 DIAGNOSIS — F1910 Other psychoactive substance abuse, uncomplicated: Secondary | ICD-10-CM

## 2020-12-08 DIAGNOSIS — F1120 Opioid dependence, uncomplicated: Secondary | ICD-10-CM | POA: Diagnosis present

## 2020-12-08 DIAGNOSIS — B182 Chronic viral hepatitis C: Secondary | ICD-10-CM | POA: Diagnosis present

## 2020-12-08 DIAGNOSIS — O09293 Supervision of pregnancy with other poor reproductive or obstetric history, third trimester: Secondary | ICD-10-CM

## 2020-12-08 DIAGNOSIS — Z20822 Contact with and (suspected) exposure to covid-19: Secondary | ICD-10-CM | POA: Diagnosis present

## 2020-12-08 DIAGNOSIS — Z3A39 39 weeks gestation of pregnancy: Secondary | ICD-10-CM | POA: Diagnosis not present

## 2020-12-08 DIAGNOSIS — O322XX0 Maternal care for transverse and oblique lie, not applicable or unspecified: Principal | ICD-10-CM | POA: Diagnosis present

## 2020-12-08 DIAGNOSIS — O99320 Drug use complicating pregnancy, unspecified trimester: Secondary | ICD-10-CM

## 2020-12-08 DIAGNOSIS — O98413 Viral hepatitis complicating pregnancy, third trimester: Secondary | ICD-10-CM | POA: Diagnosis not present

## 2020-12-08 DIAGNOSIS — F17210 Nicotine dependence, cigarettes, uncomplicated: Secondary | ICD-10-CM | POA: Diagnosis present

## 2020-12-08 DIAGNOSIS — O99324 Drug use complicating childbirth: Secondary | ICD-10-CM | POA: Diagnosis present

## 2020-12-08 DIAGNOSIS — O99323 Drug use complicating pregnancy, third trimester: Secondary | ICD-10-CM | POA: Diagnosis not present

## 2020-12-08 DIAGNOSIS — O26893 Other specified pregnancy related conditions, third trimester: Secondary | ICD-10-CM | POA: Diagnosis present

## 2020-12-08 DIAGNOSIS — O99333 Smoking (tobacco) complicating pregnancy, third trimester: Secondary | ICD-10-CM

## 2020-12-08 DIAGNOSIS — O9842 Viral hepatitis complicating childbirth: Secondary | ICD-10-CM | POA: Diagnosis present

## 2020-12-08 LAB — HIV ANTIBODY (ROUTINE TESTING W REFLEX): HIV Screen 4th Generation wRfx: NONREACTIVE

## 2020-12-08 LAB — CBC
HCT: 36 % (ref 36.0–46.0)
Hemoglobin: 12.1 g/dL (ref 12.0–15.0)
MCH: 27.3 pg (ref 26.0–34.0)
MCHC: 33.6 g/dL (ref 30.0–36.0)
MCV: 81.1 fL (ref 80.0–100.0)
Platelets: 269 10*3/uL (ref 150–400)
RBC: 4.44 MIL/uL (ref 3.87–5.11)
RDW: 13.4 % (ref 11.5–15.5)
WBC: 11.9 10*3/uL — ABNORMAL HIGH (ref 4.0–10.5)
nRBC: 0 % (ref 0.0–0.2)

## 2020-12-08 LAB — RESP PANEL BY RT-PCR (FLU A&B, COVID) ARPGX2
Influenza A by PCR: NEGATIVE
Influenza B by PCR: NEGATIVE
SARS Coronavirus 2 by RT PCR: NEGATIVE

## 2020-12-08 LAB — TYPE AND SCREEN
ABO/RH(D): A POS
Antibody Screen: NEGATIVE

## 2020-12-08 MED ORDER — IBUPROFEN 600 MG PO TABS
600.0000 mg | ORAL_TABLET | Freq: Four times a day (QID) | ORAL | Status: DC
  Administered 2020-12-08 – 2020-12-10 (×5): 600 mg via ORAL
  Filled 2020-12-08 (×5): qty 1

## 2020-12-08 MED ORDER — OXYTOCIN 10 UNIT/ML IJ SOLN
INTRAMUSCULAR | Status: AC
  Administered 2020-12-08: 10 [IU]
  Filled 2020-12-08: qty 1

## 2020-12-08 MED ORDER — SOD CITRATE-CITRIC ACID 500-334 MG/5ML PO SOLN: 30.0000 mL | ORAL | Status: DC | PRN

## 2020-12-08 MED ORDER — WITCH HAZEL-GLYCERIN EX PADS: 1.0000 "application " | MEDICATED_PAD | CUTANEOUS | Status: DC | PRN

## 2020-12-08 MED ORDER — OXYTOCIN BOLUS FROM INFUSION: 333.0000 mL | Freq: Once | INTRAVENOUS | Status: DC

## 2020-12-08 MED ORDER — ONDANSETRON HCL 4 MG/2ML IJ SOLN: 4.0000 mg | Freq: Four times a day (QID) | INTRAMUSCULAR | Status: DC | PRN

## 2020-12-08 MED ORDER — BENZOCAINE-MENTHOL 20-0.5 % EX AERO
1.0000 "application " | INHALATION_SPRAY | CUTANEOUS | Status: DC | PRN
  Administered 2020-12-08: 1 via TOPICAL
  Filled 2020-12-08: qty 56

## 2020-12-08 MED ORDER — ONDANSETRON HCL 4 MG PO TABS: 4.0000 mg | ORAL_TABLET | ORAL | Status: DC | PRN

## 2020-12-08 MED ORDER — ONDANSETRON HCL 4 MG/2ML IJ SOLN: 4.0000 mg | INTRAMUSCULAR | Status: DC | PRN

## 2020-12-08 MED ORDER — LACTATED RINGERS IV SOLN: INTRAVENOUS | Status: DC

## 2020-12-08 MED ORDER — TETANUS-DIPHTH-ACELL PERTUSSIS 5-2.5-18.5 LF-MCG/0.5 IM SUSY: 0.5000 mL | PREFILLED_SYRINGE | Freq: Once | INTRAMUSCULAR | Status: DC

## 2020-12-08 MED ORDER — DIPHENHYDRAMINE HCL 25 MG PO CAPS: 25.0000 mg | ORAL_CAPSULE | Freq: Four times a day (QID) | ORAL | Status: DC | PRN

## 2020-12-08 MED ORDER — DIBUCAINE (PERIANAL) 1 % EX OINT: 1.0000 "application " | TOPICAL_OINTMENT | CUTANEOUS | Status: DC | PRN

## 2020-12-08 MED ORDER — LACTATED RINGERS IV SOLN: 500.0000 mL | INTRAVENOUS | Status: DC | PRN

## 2020-12-08 MED ORDER — OXYTOCIN-SODIUM CHLORIDE 30-0.9 UT/500ML-% IV SOLN: 2.5000 [IU]/h | INTRAVENOUS | Status: DC

## 2020-12-08 MED ORDER — OXYCODONE-ACETAMINOPHEN 5-325 MG PO TABS: 1.0000 | ORAL_TABLET | ORAL | Status: DC | PRN

## 2020-12-08 MED ORDER — SENNOSIDES-DOCUSATE SODIUM 8.6-50 MG PO TABS
2.0000 | ORAL_TABLET | Freq: Every day | ORAL | Status: DC
  Filled 2020-12-08: qty 2

## 2020-12-08 MED ORDER — ZOLPIDEM TARTRATE 5 MG PO TABS: 5.0000 mg | ORAL_TABLET | Freq: Every evening | ORAL | Status: DC | PRN

## 2020-12-08 MED ORDER — SIMETHICONE 80 MG PO CHEW: 80.0000 mg | CHEWABLE_TABLET | ORAL | Status: DC | PRN

## 2020-12-08 MED ORDER — OXYCODONE-ACETAMINOPHEN 5-325 MG PO TABS: 2.0000 | ORAL_TABLET | ORAL | Status: DC | PRN

## 2020-12-08 MED ORDER — PRENATAL MULTIVITAMIN CH
1.0000 | ORAL_TABLET | Freq: Every day | ORAL | Status: DC
  Administered 2020-12-09: 1 via ORAL
  Filled 2020-12-08 (×2): qty 1

## 2020-12-08 MED ORDER — ACETAMINOPHEN 325 MG PO TABS: 650.0000 mg | ORAL_TABLET | ORAL | Status: DC | PRN

## 2020-12-08 MED ORDER — LIDOCAINE HCL (PF) 1 % IJ SOLN
30.0000 mL | INTRAMUSCULAR | Status: AC | PRN
  Administered 2020-12-08: 30 mL via SUBCUTANEOUS
  Filled 2020-12-08: qty 30

## 2020-12-08 MED ORDER — ACETAMINOPHEN 325 MG PO TABS
650.0000 mg | ORAL_TABLET | ORAL | Status: DC | PRN
  Administered 2020-12-08: 650 mg via ORAL
  Filled 2020-12-08: qty 2

## 2020-12-08 MED ORDER — COCONUT OIL OIL: 1.0000 "application " | TOPICAL_OIL | Status: DC | PRN

## 2020-12-08 NOTE — H&P (Signed)
OB ADMISSION/ HISTORY & PHYSICAL:  Admission Date: 12/08/2020  8:25 PM  Admit Diagnosis: Normal labor  Rita Trujillo is a 25 y.o. female G20P0101 [redacted]w[redacted]d presenting for active labor and SROM. Endorses active FM. Ctx began @ 2000.  History of current pregnancy: G2P0101   Primary OB Provider: [facility] Patient entered care with [facility] at 14.2 wks.   EDC 12/14/20 by [place] @ 13.1 wks. Unsure LMP 03/01/20. Anatomy scan:  23.2 wks, complete w/ posterior placenta.   Antenatal testing: for methadone exposure started at 30 weeks Last evaluation: 36.6  wks  Significant prenatal events:  Chronic Hep C since 2016. Viral load 1,240,000 @ 28 wks. Seen by MFM ok for SVD and BF. AST and LFT WNL. Follow up with ID PP.  Patient Active Problem List   Diagnosis Date Noted   Normal labor 12/08/2020   Normal labor and delivery 12/08/2020   NSVD (normal spontaneous vaginal delivery) 04/20/2014   Active labor 04/19/2014   Heroin dependence (HCC) 10/15/2012    Prenatal Labs: ABO, Rh:  A Positive Antibody:  Negative Rubella:   Immune RPR:   NR HBsAg:   Negative HIV:   NR GTT: 81 GBS:   Negative GC/CHL: Negative/Negative Genetics: Low risk female Tdap/influenza vaccines:    OB History  Gravida Para Term Preterm AB Living  2 1 0 1 0 1  SAB IAB Ectopic Multiple Live Births  0 0 0   1    # Outcome Date GA Lbr Len/2nd Weight Sex Delivery Anes PTL Lv  2 Current           1 Preterm 04/19/14 [redacted]w[redacted]d 22:33 / 00:20 2420 g M Vag-Spont EPI  LIV    Medical / Surgical History: Past medical history:  Past Medical History:  Diagnosis Date   Hepatitis C     Past surgical history: No past surgical history on file. Family History:  Family History  Problem Relation Age of Onset   COPD Mother    Cancer Maternal Aunt    Diabetes Maternal Grandmother    Cancer Maternal Grandmother     Social History:  reports that she has been smoking cigarettes. She has never used smokeless tobacco. She reports current drug use.  Drugs: IV and Cocaine. She reports that she does not drink alcohol.  Allergies: Amoxicillin and Penicillins   Current Medications at time of admission:  Prior to Admission medications   Medication Sig Start Date End Date Taking? Authorizing Provider  methadone (DOLOPHINE) 10 MG/5ML solution Take 200 mg by mouth daily.    [provider]  Prenatal MV & Min w/FA-DHA (PRENATAL GUMMIES PO) Take by mouth.    [provider]    Review of Systems: Constitutional: Negative   HENT: Negative   Eyes: Negative   Respiratory: Negative   Cardiovascular: Negative   Gastrointestinal: Negative  Genitourinary: negative for bloody show, positive for LOF   Musculoskeletal: Negative   Skin: Negative   Neurological: Negative   Endo/Heme/Allergies: Negative   Psychiatric/Behavioral: Negative    Physical Exam: VS: Blood pressure (!) 142/88, pulse 95, temperature 98.7 F (37.1 C), temperature source Oral, resp. rate 17, height 5\' 5"  (1.651 m), weight 81.6 kg, last menstrual period 03/01/2020, unknown if currently breastfeeding. AAO x3, no signs of distress Cardiovascular: RRR Respiratory: Lung fields clear to ausculation GU/GI: Abdomen gravid, non-tender, non-distended, active FM, vertex Extremities: 1+  edema to bilateral lower extremities, negative for pain, tenderness, and cords  Cervical exam:Dilation: 9 Effacement (%): 100 Station:  Plus 1 Exam by:: Quintella Baton, RN FHR: baseline rate 135 / variability moderate  TOCO:    Prenatal Transfer Tool  Maternal Diabetes: No Genetic Screening: Normal Maternal Ultrasounds/Referrals: Other: MFM for methadone use Fetal Ultrasounds or other Referrals:  Referred to Materal Fetal Medicine  Maternal Substance Abuse:  Yes:  Type: Methadone Significant Maternal Medications:  None Significant Maternal Lab Results: Group B Strep negative, Hepatitis C positive. OK for SVD per MFM.    Assessment: 25 y.o. G2P0101 [redacted]w[redacted]d arrived at MAU  complete. SROM @ 2000.   Active stage of labor FHR category 1 GBS negative Pain management plan: epidural    Plan:  Admit to L&D Routine admission orders Epidural PRN ID referral PP for Hep C  Methadone 120mg  qd for hx of opioid abuse   MSN, CNM 12/08/2020 9:21 PM

## 2020-12-08 NOTE — Plan of Care (Signed)
  Problem: Education: Goal: Knowledge of Childbirth will improve Outcome: Adequate for Discharge Goal: Ability to make informed decisions regarding treatment and plan of care will improve Outcome: Adequate for Discharge Goal: Ability to state and carry out methods to decrease the pain will improve Outcome: Adequate for Discharge Goal: Individualized Educational Video(s) Outcome: Not Met (add Reason)   Problem: Coping: Goal: Ability to verbalize concerns and feelings about labor and delivery will improve Outcome: Adequate for Discharge   Problem: Life Cycle: Goal: Ability to make normal progression through stages of labor will improve Outcome: Completed/Met Goal: Ability to effectively push during vaginal delivery will improve Outcome: Completed/Met   Problem: Role Relationship: Goal: Will demonstrate positive interactions with the child Outcome: Adequate for Discharge   Problem: Safety: Goal: Risk of complications during labor and delivery will decrease Outcome: Adequate for Discharge   Problem: Pain Management: Goal: Relief or control of pain from uterine contractions will improve Outcome: Adequate for Discharge

## 2020-12-08 NOTE — MAU Note (Signed)
..  Rita Trujillo is a 25 y.o. at [redacted]w[redacted]d here in MAU reporting: contractions less than 2 minutes apart and a gush of fluid on her way to the hospital and bloody show. Reports +FM.  Patient brought directly into room from lobby. SVE by Alvino Chapel, RN 9cm 100 +1.  FHR 130 Admission orders received and report given to LD charge nurse.  Crissie Reese, MD at bedside and transported to LD.

## 2020-12-08 NOTE — Progress Notes (Signed)
Kim Holshouer cnm, ok for pt not to have IV access.

## 2020-12-08 NOTE — Lactation Note (Signed)
This note was copied from a baby's chart. Lactation Consultation Note  Patient Name: Rita Trujillo [id number] Date: 12/08/2020 Reason for consult: L&D Initial assessment;Term Age:24 hours  L&D consult with >60 minutes old infant and P2 mother. Congratulated family on newborn.  Offered assistance with latch, laid back position. Several attempts to latch unsuccessful, Assisted with hand expression and spoonfed ~2 mL. Placed newborn back to skin to skin.   Discussed STS as ideal transition for infants after birth helping with temperature, blood sugar and comfort. Talked about primal reflexes such as rooting, hands to mouth, searching for the breast among others. Explained LC services availability during postpartum stay. Thanked family for their time.      Maternal Data Has patient been taught Hand Expression?: Yes Does the patient have breastfeeding experience prior to this delivery?: No  Feeding Mother's Current Feeding Choice: Breast Milk  LATCH Score Latch: Repeated attempts needed to sustain latch, nipple held in mouth throughout feeding, stimulation needed to elicit sucking reflex.  Audible Swallowing: None  Type of Nipple: Everted at rest and after stimulation (short shafted)  Comfort (Breast/Nipple): Soft / non-tender  Hold (Positioning): Assistance needed to correctly position infant at breast and maintain latch.  LATCH Score: 6  Interventions Interventions: Breast feeding basics reviewed;Assisted with latch;Skin to skin;Hand express;Breast massage;Expressed milk;Education  Discharge WIC Program: Yes  Consult Status Consult Status: Follow-up from L&D    [name] [name] [name] 12/08/2020, 9:52 PM

## 2020-12-09 LAB — CBC
HCT: 36.8 % (ref 36.0–46.0)
Hemoglobin: 12.3 g/dL (ref 12.0–15.0)
MCH: 27.2 pg (ref 26.0–34.0)
MCHC: 33.4 g/dL (ref 30.0–36.0)
MCV: 81.4 fL (ref 80.0–100.0)
Platelets: 283 10*3/uL (ref 150–400)
RBC: 4.52 MIL/uL (ref 3.87–5.11)
RDW: 13.6 % (ref 11.5–15.5)
WBC: 15.5 10*3/uL — ABNORMAL HIGH (ref 4.0–10.5)
nRBC: 0 % (ref 0.0–0.2)

## 2020-12-09 LAB — RPR: RPR Ser Ql: NONREACTIVE

## 2020-12-09 MED ORDER — METHADONE HCL 10 MG/ML PO CONC
200.0000 mg | Freq: Every day | ORAL | Status: DC
  Administered 2020-12-09 – 2020-12-10 (×2): 200 mg via ORAL
  Filled 2020-12-09 (×2): qty 20

## 2020-12-09 NOTE — Progress Notes (Signed)
Post Partum Day 1  Subjective: Patient doing well w/o complaints.  She denies heavy bleeding or pain.  She is breast feeding w/ some latch issues  Objective: Blood pressure 127/88, pulse 74, temperature 98 F (36.7 C), temperature source Oral, resp. rate 18, height 5\' 5"  (1.651 m), weight 81.6 kg, last menstrual period 03/01/2020, SpO2 99 %, unknown if currently breastfeeding.  Physical Exam:  General: alert, cooperative, and no distress Lochia: appropriate Uterine Fundus: firm Incision: n/a DVT Evaluation: No evidence of DVT seen on physical exam. Negative Homan's sign. No cords or calf tenderness.  Recent Labs    12/08/20 2126 12/09/20 0425  HGB 12.1 12.3  HCT 36.0 36.8    Assessment/Plan: Continue routine post partum care Will initiate methadone therapy as she had as outpatient - 200 mcg daily Social work consultation.  Circumcision to be done prior to discharge   LOS: 1 day   [facility] 12/09/2020, 12:00 PM

## 2020-12-09 NOTE — Clinical Social Work Maternal (Addendum)
CLINICAL SOCIAL WORK MATERNAL/CHILD NOTE  Patient Details  Name: DAMITA EPPARD MRN: [id number] Date of Birth: [birth year]  Date:  12/09/2020  Clinical Social Worker Initiating Note:  Kathrin Greath[place], [place] Date/Time: Initiated:  12/09/20/1143     Child's Name:  [facility]   Biological Parents:  Mother, Father  (FOB: Loralyn Freshwater 04-14-1993)  Need for Interpreter:  None   Reason for Referral:  Current Substance Use/Substance Use During Pregnancy     Address:  [address] [place] [address]    Phone number:  [phone] (home)     Additional phone number:   Household Members/Support Persons (HM/SP):   Household Member/Support Person 1, Household Member/Support Person 2   HM/SP Name Relationship DOB or Age  HM/SP -[place] Significant Other 04-14-1993  HM/SP -2 Zemirah Krasinski Brother 10-08-1986  HM/SP -3        HM/SP -4        HM/SP -5        HM/SP -6        HM/SP -7        HM/SP -8          Natural Supports (not living in the home):  Extended Family, Friends   Professional Supports: Other (Comment) 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 Drusilla Kanner)   Employment: Unemployed   Type of Work:     Education:  Other (GED)   Homebound arranged:    Museum/gallery curator Resources:  Medicaid   Other Resources:  Physicist, medical  , [place]   Cultural/Religious Considerations Which May Impact Care:    Strengths:  Ability to meet basic needs  , Home prepared for child  , Pediatrician chosen   Psychotropic Medications:         Pediatrician:    Solicitor area  Pediatrician List:   [facility] ([place])  [place]      Pediatrician Fax Number:    Risk Factors/Current Problems:  Substance Use     Cognitive State:  Able to Concentrate  , Alert  , Insightful  , Linear Thinking     Mood/Affect:  Calm  , Comfortable  , Happy      CSW Assessment: CSW received consult for 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  Does not have custody of 1st child. CSW met with MOB to offer support and complete assessment.    CSW met with MOB at bedside and introduced CSW role. CSW congratulated MOB. CSW observed MOB sitting up in the bed holding the infant and FOB asleep on the couch. CSW offered to return at a different time to give MOB privacy during the assessment. MOB stated, "You can talk about anything in front him he knows everything." MOB presented pleasant and welcomed CSW visit. MOB provided CSW with her current address: [address], [place], [place]. MOB shared she lives with FOB and her brother (see chart above) and identifies them as her supports. CSW inquired how MOB has felt since giving birth. MOB shared feeling good and happy about the baby. MOB disclosed she did not plan to have a baby but when she found out she was pregnant she wanted to seek treatment. MOB shared early on in her pregnancy she enrolled at [facility] for Subutex treatment but had severe precipitated withdrawals. Then enrolled at the [facility] in June where she is treated with 231m Methadone daily. MOB shared she has great support there and sees counselor EDrusilla Kanner MOB disclosed to CSW during the pregnancy she used heroin during her second trimester because she was still withdrawing while the clinic adjusted the Methadone. She also reported that she smoked Marijuana two weeks ago, recreationally. MOB denied using any other substance. CSW inquired if FOB uses substance. MOB reported FOB does not use substance and encourages her to quit. CSW informed MOB about the hospital drug screen policy. MOB made aware that infant's UDS was negative so CSW will follow CDS and make a report to CPS, if warranted. MOB reported understanding. CSW inquired if MOB has CPS history. MOB disclosed that [facility], a report was filed due to her ongoing use of cocaine and her parental rights were terminated. MOB shared her son (Melina Modena) was adopted by her previous partner paternal grandmother however she is actively involved in his life. MOB made aware that CSW will have to make a report to CPS because she does not have custody a child. MOB reported understanding and had no questions. MOB shared she wants to be very forthcoming about everything. CSW thanked MOB for her honesty.   CSW inquired if MOB has mental health history. MOB denied mental health history. CSW inquired if MOB experienced PPD. MOB denied history of PPD. MOB shared she is not interested in therapy at his time but allowed CSW to provide education regarding the baby blues period vs. perinatal mood disorders, discussed treatment and gave resources for mental health follow up if concerns arise.  CSW recommended MOB complete a self-evaluation during the postpartum time period using the New Mom Checklist from Postpartum Progress and encouraged MOB to contact a medical professional if symptoms are noted at any time. MOB reported she feels comfortable reaching out to her doctor if concerns arise. MOB denied SI/HI.   CSW inquired if MOB has items for the infant. MOB reported she has all essential items for the infant including a bassinet where the infant will sleep. CSW provided review of Sudden Infant Death Syndrome (SIDS) precautions.  MOB has chosen [facility] for infant's follow up care. MOB reported she has transportation via relatives and Medicaid [employer]. MOB reported she receives WIC/FS benefits. MOB informed CSW she was interested in FOmnicareand gave CSW permission to make the referral. MOB inquired about hospital meal vouchers for FOB. CSW to provide meal vouchers to FOB. CSW assessed MOB for additional needs. MOB reported no further needs.    -CSW made  a report to CPS: MOB does not have custody of child.  -CSW will continue to follow CDS and make a report if warranted.   CSW Plan/Description:  CSW Awaiting CPS Disposition Plan, CSW Will Continue to Monitor Umbilical Cord Tissue Drug Screen Results and Make Report if Warranted, Sudden Infant Death Syndrome (SIDS) Education, Psychosocial Support and Ongoing Assessment of Needs, Hospital Drug Screen Policy Information, Other Information/Referral to [employer] Perinatal Mood and Anxiety Disorder (PMADs) Education, Child Protective Service Report      NLia Hopping LCSW 12/09/2020, 1:08 PM

## 2020-12-09 NOTE — Lactation Note (Signed)
This note was copied from a baby's chart. Lactation Consultation Note  Patient Name: Rita Trujillo [id number] Date: 12/09/2020 Reason for consult: Initial assessment;Term Age:24 hours  Initial visit to 19 hours old infant of a P2 mother. Mother states infant has been latching well but he is too sleepy. Mother reports expressing into baby's mouth because he falls sleep.   LC offered assistance with latch but parents state baby just fed.  LC reviewed hand expression. Nipples are short shafted. Provided hand pump with 21-mm, for nipple eversion and supplementation if needed. Drops expressed during demonstration.  Reviewed normal newborn behavior during after 24h, expected output, tummy size and feeding frequency.   Plan: 1-Skin to skin, aim for a deep, comfortable latch and breastfeed on demand or 8-12 times in 24h period. 2-Encouraged maternal rest, hydration and food intake.  3-Contact LC as needed for feeds/support/concerns/questions   All questions answered at this time. Provided Lactation services brochure and promoted INJoy booklet information.     Maternal Data Has patient been taught Hand Expression?: Yes  Feeding Mother's Current Feeding Choice: Breast Milk  Interventions Interventions: Breast feeding basics reviewed;Skin to skin;Hand express;Breast massage;Hand pump;Expressed milk;Education;LC Services brochure  Discharge Pump: Manual  Consult Status Consult Status: Follow-up Date: 12/10/20 Follow-up type: In-patient    Lesslie Mckeehan [name] [name] 12/09/2020, 4:38 PM

## 2020-12-10 MED ORDER — ACETAMINOPHEN 325 MG PO TABS: 650.0000 mg | ORAL_TABLET | ORAL | 3 refills | Status: DC | PRN

## 2020-12-10 MED ORDER — METHADONE HCL 10 MG/ML PO CONC: 200.0000 mg | Freq: Every morning | ORAL | 0 refills | Status: AC

## 2020-12-10 MED ORDER — IBUPROFEN 600 MG PO TABS
600.0000 mg | ORAL_TABLET | Freq: Four times a day (QID) | ORAL | 4 refills | Status: DC | PRN
Start: 2020-12-10 — End: 2022-11-16

## 2020-12-10 NOTE — Lactation Note (Signed)
This note was copied from a baby's chart. Lactation Consultation Note  Patient Name: Boy Doll Frazee [id number] Date: 12/10/2020 Reason for consult: Follow-up assessment;Term;Primapara;1st time breastfeeding;Other (Comment) (ESC baby) Age:24 hours   P1 mother whose infant is now 90 hours old.  This is a term baby at 39+1 weeks.  This is an ESC baby.  Mother's feeding preference is breast/formula.  Baby "Zayland" has not fed in 6 hours.  Spoke with RN who informed me that mother was awake.  Arrived to find mother pumping with the manual pump.  Obtained 2.5 mls of EBM which I syringe fed back to baby.  Offered to assist with latching and mother interested.  Assisted to latch but baby was not interested in initiating a suck.  He pushed back and became very fussy.  Calmed with EBM drops and attempted again with the same results.  Suggested a different breast feeding hold but "Zayland" pushed back and became fussy.  Suggested parents begin supplementation and discussed options.  Parents in agreement to supplement with formula.  Demonstrated paced bottle feeding with the gold slow flow nipple.  (Purple extra slow flow would have been preferred, however, we were out of stock).  "Zayland" showed no interest in sucking initially.  Required suck training, cheek and jaw support to begin sucking.  Side lying position used for feeding and he consumed 13 mls in 15 minutes.  Recommended parents call their RN at the next feeding if they have any difficulty with formula feeding.  They may need further guidance and support.  Reviewed supplementation guidelines.  Suggested mother begin pumping with the DEBP.  Pump parts, assembly and cleaning reviewed.  #24 flange is appropriate at this time.  Observed mother pumping for 15 minutes with no volume at this time.  Reassurance given.  Feeding plan to include breast feeding followed by supplementation at least every three hours or sooner if "Zayland" desires.  Father will  provide the supplementation while mother pumps.    Mother has a DEBP for home use.  She is also a WIC participant in [place].  Suggested she call the [facility] department on Monday to determine pump eligibility as needed.  Father supportive.  RN updated.   Maternal Data Has patient been taught Hand Expression?: Yes Does the patient have breastfeeding experience prior to this delivery?: No  Feeding Mother's Current Feeding Choice: Breast Milk and Formula  LATCH Score Latch: Repeated attempts needed to sustain latch, nipple held in mouth throughout feeding, stimulation needed to elicit sucking reflex.  Audible Swallowing: None  Type of Nipple: Everted at rest and after stimulation  Comfort (Breast/Nipple): Soft / non-tender  Hold (Positioning): Assistance needed to correctly position infant at breast and maintain latch.  LATCH Score: 6   Lactation Tools Discussed/Used Tools: Pump;Flanges Flange Size: 24 Breast pump type: Double-Electric Breast Pump;Manual Pump Education: Setup, frequency, and cleaning;Milk Storage Reason for Pumping: Breast stimulation for supplementation Pumping frequency: Every three hours  Interventions Interventions: Breast feeding basics reviewed;Assisted with latch;Skin to skin;Breast massage;Hand express;Breast compression;Hand pump;Expressed milk;Position options;Support pillows;Adjust position;DEBP;Education  Discharge Pump: DEBP;Manual;Personal WIC Program: Yes  Consult Status Consult Status: Follow-up Date: 12/11/20 Follow-up type: In-patient    Kebrina Friend R Lekeisha Arenas 12/10/2020, 5:10 AM

## 2020-12-10 NOTE — Progress Notes (Signed)
Per Frances Thompson, CPS worker Child & Family Team meeting will be held prior to infant's discharge.   Lynnett Langlinais, MSW, LCSW-A Clinical Social Worker- Weekends [phone]  

## 2020-12-10 NOTE — Discharge Summary (Signed)
Postpartum Discharge Summary  Date of Service updated 12/10/20     Patient Name: Rita Trujillo DOB: Jun 05, 1995 MRN: [id number]  Date of admission: 12/08/2020 Delivery date:12/08/2020  Delivering provider: Clarnce Flock  Date of discharge: 12/10/2020  Admitting diagnosis: Normal labor [O80, Z37.9] Normal labor and delivery [O80] Intrauterine pregnancy: [redacted]w[redacted]d    Secondary diagnosis:  Active Problems:   Normal labor   Normal labor and delivery  Additional problems: history of opioid abuse    Discharge diagnosis: Term Pregnancy Delivered                                              Post partum procedures: none Augmentation: N/A Complications: None  Hospital course: Onset of Labor With Vaginal Delivery      25y.o. yo [id number] 333w1das admitted in Active Labor on 12/08/2020. Patient had an uncomplicated labor course as follows:  Membrane Rupture Time/Date:  ,12/08/2020   Delivery Method:Vaginal, Spontaneous  Episiotomy: None  Lacerations:  Labial  Patient had an uncomplicated postpartum course.  She is ambulating, tolerating a regular diet, passing flatus, and urinating well. Patient is discharged home in stable condition on 12/10/20.  Newborn Data: Birth date:12/08/2020  Birth time:8:42 PM  Gender:Female  Living status:Living  Apgars:8 ,9  Weight:3060 g   Magnesium Sulfate received: No BMZ received: No Rhophylac:No MMR:No T-DaP:Given prenatally Flu: N/A Transfusion:No  Physical exam  Vitals:   12/09/20 0830 12/09/20 1235 12/09/20 2044 12/10/20 0500  BP: 127/88 (!) 126/92 131/84 128/83  Pulse: 74 88 78 66  Resp: '18 18 18 16  ' Temp: 98 F (36.7 C) 98.1 F (36.7 C) 98.6 F (37 C) 98 F (36.7 C)  TempSrc: Oral Oral Oral Oral  SpO2:   100% 100%  Weight:      Height:       General: alert, cooperative, and no distress Lochia: appropriate Uterine Fundus: firm Incision: N/A DVT Evaluation: No evidence of DVT seen on physical exam. Negative Homan's  sign. No cords or calf tenderness. Labs: Lab Results  Component Value Date   WBC 15.5 (H) 12/09/2020   HGB 12.3 12/09/2020   HCT 36.8 12/09/2020   MCV 81.4 12/09/2020   PLT 283 12/09/2020   CMP Latest Ref Rng & Units 04/19/2014  Glucose 70 - 99 mg/dL 101(H)  BUN 6 - 23 mg/dL 6  Creatinine 0.50 - 1.00 mg/dL 0.33(L)  Sodium 135 - 145 mmol/L 133(L)  Potassium 3.5 - 5.1 mmol/L 3.5  Chloride 96 - 112 mmol/L 105  CO2 19 - 32 mmol/L 22  Calcium 8.4 - 10.5 mg/dL 8.7  Total Protein 6.0 - 8.3 g/dL 6.1  Total Bilirubin 0.3 - 1.2 mg/dL 0.7  Alkaline Phos 47 - 119 U/L 170(H)  AST 0 - 37 U/L 24  ALT 0 - 35 U/L 23   Edinburgh Score: Edinburgh Postnatal Depression Scale Screening Tool 12/10/2020  I have been able to laugh and see the funny side of things. 0  I have looked forward with enjoyment to things. 0  I have blamed myself unnecessarily when things went wrong. 0  I have been anxious or worried for no good reason. 0  I have felt scared or panicky for no good reason. 0  Things have been getting on top of me. 0  I have been so unhappy that I have had  difficulty sleeping. 0  I have felt sad or miserable. 0  I have been so unhappy that I have been crying. 0  The thought of harming myself has occurred to me. 0  Edinburgh Postnatal Depression Scale Total 0      After visit meds:  Allergies as of 12/10/2020       Reactions   Amoxicillin Anaphylaxis, Hives, Itching   Penicillin V Anaphylaxis, Hives, Itching   Penicillins Anaphylaxis, Hives, Itching        Medication List     TAKE these medications    acetaminophen 325 MG tablet Commonly known as: Tylenol Take 2 tablets (650 mg total) by mouth every 4 (four) hours as needed for moderate pain (for pain scale < 4).   ibuprofen 600 MG tablet Commonly known as: ADVIL Take 1 tablet (600 mg total) by mouth every 6 (six) hours as needed for moderate pain or cramping. What changed:  medication strength how much to take reasons  to take this   methadone 10 MG/ML solution Commonly known as: DOLOPHINE Take 200 mg by mouth in the morning.   PRENATAL GUMMIES PO Take 1 tablet by mouth daily.         Discharge home in stable condition Infant Feeding: Bottle and Breast Infant Disposition:rooming in Discharge instruction: per After Visit Summary and Postpartum booklet. Activity: Advance as tolerated. Pelvic rest for 6 weeks.  Diet: routine diet Anticipated Birth Control: Unsure Postpartum Appointment:6 weeks Additional Postpartum F/U: Postpartum Depression checkup one to two weeks Future Appointments:No future appointments. Follow up Visit: call to make appointment      12/10/2020 Sanjuana Kava, MD

## 2020-12-10 NOTE — Progress Notes (Signed)
CSW contact [place] CPS to receive update on infant's disposition. Per Donna Wilder, CPS MOB is to be seen on today by [place] CPS.   Illiana Losurdo, MSW, LCSW-A Clinical Social Worker- Weekends [phone]  

## 2020-12-11 ENCOUNTER — Ambulatory Visit: Payer: Self-pay

## 2020-12-11 NOTE — Lactation Note (Signed)
This note was copied from a baby's chart. Lactation Consultation Note  Patient Name: Rita Trujillo [id number] Date: 12/11/2020 Reason for consult: Follow-up assessment;Other (Comment) (NAS) Age:24 hours  LC in to room for follow up. Mother states she is mainly formula feeding.  Talked about feeding volume per age.  Mother explains she has not pumped because she was only getting a couple of drops. LC briefly discussed breast changes consistent with lactogenesis II and relief options. Mother shows appreciation for information. Encouraged to contact [facility] for questions or concerns as needed.   Feeding Mother's Current Feeding Choice: Formula  Lactation Tools Discussed/Used Tools: Pump Breast pump type: Double-Electric Breast Pump;Manual  Interventions Interventions: Skin to skin;DEBP;Education;Pace feeding  Discharge Discharge Education: Engorgement and breast care Pump: DEBP;Manual  Consult Status Consult Status: Follow-up Date: 12/12/20 Follow-up type: In-patient    Kishan Wachsmuth [name] [name] 12/11/2020, 2:15 PM

## 2020-12-11 NOTE — Lactation Note (Signed)
This note was copied from a baby's chart. Lactation Consultation Note  Patient Name: Rita Trujillo [id number] Date: 12/11/2020 Reason for consult: Follow-up assessment;RN request;Engorgement Age:24 hours  LC went in to examine Mom's breast, RN Candace Cruise stating signs of engorgement. Mom breast hard not able to express milk due to fullness. Mom laying on her back with ice packs for 20 mins.  Mom to use coconut oil and with breast massage with reverse pressure to release milk. Once milk starts to leak, use moist heat express milk and pump for comfort.   Mom talking to family members to see if they can get some cabbage leaves.  LC reviewed plan above with RN and gave Mom written instructions.  All questions answered at the end of the visit.   Maternal Data    Feeding    LATCH Score                    Lactation Tools Discussed/Used    Interventions Interventions: Education;Ice;DEBP;Coconut oil  Discharge    Consult Status Consult Status: Follow-up Date: 12/12/20 Follow-up type: In-patient    Rita Santino  Trujillo 12/11/2020, 6:33 PM

## 2020-12-12 ENCOUNTER — Ambulatory Visit: Payer: Self-pay

## 2020-12-12 NOTE — Lactation Note (Signed)
This note was copied from a baby's chart. Lactation Consultation Note  Patient Name: Rita Trujillo [id number] Date: 12/12/2020   Age:24 hours LC entered the room, to follow up with mom due to engorgement but  mom  was asleep at this time.  Maternal Data    Feeding    LATCH Score                    Lactation Tools Discussed/Used    Interventions    Discharge    Consult Status      Danelle Earthly 12/12/2020, 12:09 AM

## 2020-12-12 NOTE — Lactation Note (Signed)
This note was copied from a baby's chart. Lactation Consultation Note  Patient Name: Rita Trujillo [id number] Date: 12/12/2020   Age:24 days  Spoke with mother who states she is only formula feeding.   Feeding Nipple Type: Nfant Slow Flow (purple)     Hardie Pulley 12/12/2020, 11:18 AM

## 2020-12-12 NOTE — Lactation Note (Signed)
This note was copied from a baby's chart. Lactation Consultation Note  Patient Name: Rita Trujillo QQPYP'P Date: 12/12/2020 Reason for consult: Follow-up assessment Age:24 days  Mother states she is only formula feeding now and declined lactation assistance.  Mother states she applied ice packs yesterday and her breasts are no longer painful although full. She has been given education regarding cabbage leaves.  Feeding Mother's Current Feeding Choice: Formula Nipple Type: Nfant Slow Flow (purple)   Consult Status Consult Status: Complete (mother declined follow up)    Hardie Pulley 12/12/2020, 11:22 AM

## 2020-12-14 ENCOUNTER — Other Ambulatory Visit: Payer: Medicaid Other

## 2020-12-20 ENCOUNTER — Telehealth (HOSPITAL_COMMUNITY): Payer: Self-pay

## 2020-12-20 NOTE — Telephone Encounter (Signed)
No answer. Left message to return nurse call.  Marcelino Duster [facility] 12/20/2020,1854

## 2022-01-10 ENCOUNTER — Emergency Department (HOSPITAL_BASED_OUTPATIENT_CLINIC_OR_DEPARTMENT_OTHER)
Admission: EM | Admit: 2022-01-10 | Discharge: 2022-01-10 | Disposition: A | Discharge status: Home / Self Care | Payer: Medicaid Other | Attending: Emergency Medicine | Admitting: Emergency Medicine

## 2022-01-10 ENCOUNTER — Other Ambulatory Visit: Payer: Self-pay

## 2022-01-10 ENCOUNTER — Encounter (HOSPITAL_BASED_OUTPATIENT_CLINIC_OR_DEPARTMENT_OTHER): Payer: Self-pay | Admitting: Urology

## 2022-01-10 DIAGNOSIS — Z1152 Encounter for screening for COVID-19: Secondary | ICD-10-CM | POA: Insufficient documentation

## 2022-01-10 DIAGNOSIS — R519 Headache, unspecified: Secondary | ICD-10-CM | POA: Insufficient documentation

## 2022-01-10 LAB — RESP PANEL BY RT-PCR (FLU A&B, COVID) ARPGX2
Influenza A by PCR: NEGATIVE
Influenza B by PCR: NEGATIVE
SARS Coronavirus 2 by RT PCR: NEGATIVE

## 2022-01-10 MED ORDER — PROCHLORPERAZINE MALEATE 10 MG PO TABS
10.0000 mg | ORAL_TABLET | Freq: Once | ORAL | Status: AC
  Administered 2022-01-10: 10 mg via ORAL
  Filled 2022-01-10: qty 1

## 2022-01-10 MED ORDER — KETOROLAC TROMETHAMINE 30 MG/ML IJ SOLN
30.0000 mg | Freq: Once | INTRAMUSCULAR | Status: AC
  Administered 2022-01-10: 30 mg via INTRAMUSCULAR
  Filled 2022-01-10: qty 1

## 2022-01-10 NOTE — ED Triage Notes (Signed)
Headache x 3 days with no relief from OTC meds  Reports body aches, Denies fever or N/V

## 2022-01-10 NOTE — Discharge Instructions (Signed)

## 2022-01-10 NOTE — ED Provider Notes (Signed)
Emergency Department Provider Note   I have reviewed the triage vital signs and the nursing notes.   HISTORY  Chief Complaint Headache   HPI Rita Trujillo is a 26 y.o. female with PMH reviewed below presents emergency department for evaluation of headache for the past 3 days.  Describes gradual onset, diffuse, frontal headache.  No photophobia.  No nausea, vomiting.  She tried over-the-counter medication with no relief.  Describes some associated body aches.  No rash.  Past Medical History:  Diagnosis Date   Hepatitis C     Review of Systems  Constitutional: No fever/chills Cardiovascular: Denies chest pain. Respiratory: Denies shortness of breath. Gastrointestinal: No abdominal pain.  No nausea, no vomiting.  No diarrhea.  No constipation. Genitourinary: Negative for dysuria. Musculoskeletal: Negative for back pain. Skin: Negative for rash. Neurological: Negative for focal weakness or numbness. Positive HA.    ____________________________________________   PHYSICAL EXAM:  VITAL SIGNS: ED Triage Vitals  Enc Vitals Group     BP 01/10/22 2021 129/76     Pulse Rate 01/10/22 2021 (!) 117     Resp 01/10/22 2021 20     Temp 01/10/22 2021 98.2 F (36.8 C)     Temp src --      SpO2 01/10/22 2021 95 %     Weight --      Height 01/10/22 2022 5\' 5"  (1.651 m)   Constitutional: Alert and oriented. Well appearing and in no acute distress. Eyes: Conjunctivae are normal. PERRL. EOMI. Head: Atraumatic. Nose: No congestion/rhinnorhea. Mouth/Throat: Mucous membranes are moist.  Neck: No stridor.  No meningeal signs.  Cardiovascular: Normal rate, regular rhythm. Good peripheral circulation. Grossly normal heart sounds.   Respiratory: Normal respiratory effort.  No retractions. Lungs CTAB. Gastrointestinal: Soft and nontender. No distention.  Musculoskeletal: No lower extremity tenderness nor edema. No gross deformities of extremities. Neurologic:  Normal speech and  language. No gross focal neurologic deficits are appreciated.  Skin:  Skin is warm, dry and intact. No rash noted.  ____________________________________________   LABS (all labs ordered are listed, but only abnormal results are displayed)  Labs Reviewed  RESP PANEL BY RT-PCR (FLU A&B, COVID) [id number]   ____________________________________________   PROCEDURES  Procedure(s) performed:   Procedures  None  ____________________________________________   INITIAL IMPRESSION / ASSESSMENT AND PLAN / ED COURSE  Pertinent labs & imaging results that were available during my care of the patient were reviewed by me and considered in my medical decision making (see chart for details).   This patient is Presenting for Evaluation of HA, which does require a range of treatment options, and is a complaint that involves a high risk of morbidity and mortality.  The Differential Diagnoses includes but is not exclusive to subarachnoid hemorrhage, meningitis, encephalitis, previous head trauma, cavernous venous thrombosis, muscle tension headache, glaucoma, temporal arteritis, migraine or migraine equivalent, etc.   Critical Interventions-    Medications  ketorolac (TORADOL) 30 MG/ML injection 30 mg (30 mg Intramuscular Given 01/10/22 2134)  prochlorperazine (COMPAZINE) tablet 10 mg (10 mg Oral Given 01/10/22 2223)    Reassessment after intervention: Symptoms improved.     Clinical Laboratory Tests Ordered, included COVID and Flu negative.   Medical Decision Making: Summary:  Patient presents emergency department evaluation of headache.  No sudden onset, maximal intensity headache symptoms.  Seems most consistent with tension type headache.  COVID and flu testing negative.  Symptoms improved here in the ED with treatment.  Considered neuroimaging but reassuring exam  and history.  Defer for now.  Disposition: discharge  ____________________________________________  FINAL CLINICAL  IMPRESSION(S) / ED DIAGNOSES  Final diagnoses:  Bad headache    Note:  This document was prepared using Dragon voice recognition software and may include unintentional dictation errors.  Alona Bene, MD, [facility] Emergency Medicine    Tabius Rood, Arlyss Repress, MD 01/12/22 [phone]

## 2022-02-26 NOTE — L&D Delivery Note (Signed)
  Labor Progress: Rita Trujillo is a 27 y.o. female [phone] with IUP at Unknown admitted for rapid labor. Pt brought to MAU by EMS reporting strong urge to push.  She was transferred into MAU bed and membranes found to be bulging at, and then past introitus.  Vertex position confirmed by CNM then AROM with clear fluid.  Pt pushed through 2 contractions over intact perineum and infant delivered in LOA position.  Cord clamping delayed by 1 minute then clamped by CNM and cut by CNM.  Placenta intact and spontaneous, bleeding moderate.  Unable to obtain IV access so IM Pitocin given, then Cytotec 1000 mg rectally.  Bleeding slowed and pt stable.  Infant initially transitioned well, then required oxygen so was transported to NICU for observation. She plans on formula feeding.  She is undecided for birth control.   Delivery Note At 5:24 AM a viable and healthy female was delivered via Vaginal, Spontaneous (Presentation: Left Occiput Anterior).  APGAR: 8, 9; weight 7 lb 1.2 oz (3210 g).   Placenta status: Spontaneous;Pathology, Intact.  Cord: 3 vessels with the following complications: None.    Anesthesia: None Episiotomy: None Lacerations: None Suture Repair:  n/a Est. Blood Loss (mL): 430 at delivery, additional bleeding in 1 hour PP with total 913 ml  Mom to  HROB Unit .  Baby to NICU for oxygen requirements.  Sharen Counter 12/23/2022, 7:41 AM

## 2022-11-16 ENCOUNTER — Inpatient Hospital Stay (HOSPITAL_COMMUNITY)
Admission: AD | Admit: 2022-11-16 | Discharge: 2022-11-16 | Disposition: A | Discharge status: Home / Self Care | Payer: MEDICAID | Attending: Obstetrics & Gynecology | Admitting: Obstetrics & Gynecology

## 2022-11-16 ENCOUNTER — Encounter (HOSPITAL_COMMUNITY): Payer: Self-pay | Admitting: *Deleted

## 2022-11-16 DIAGNOSIS — O26893 Other specified pregnancy related conditions, third trimester: Secondary | ICD-10-CM

## 2022-11-16 DIAGNOSIS — R609 Edema, unspecified: Secondary | ICD-10-CM | POA: Diagnosis not present

## 2022-11-16 DIAGNOSIS — F17210 Nicotine dependence, cigarettes, uncomplicated: Secondary | ICD-10-CM | POA: Diagnosis not present

## 2022-11-16 DIAGNOSIS — O26899 Other specified pregnancy related conditions, unspecified trimester: Secondary | ICD-10-CM | POA: Diagnosis not present

## 2022-11-16 DIAGNOSIS — Z3490 Encounter for supervision of normal pregnancy, unspecified, unspecified trimester: Secondary | ICD-10-CM

## 2022-11-16 DIAGNOSIS — F1120 Opioid dependence, uncomplicated: Secondary | ICD-10-CM

## 2022-11-16 DIAGNOSIS — Z3A00 Weeks of gestation of pregnancy not specified: Secondary | ICD-10-CM | POA: Insufficient documentation

## 2022-11-16 DIAGNOSIS — R600 Localized edema: Secondary | ICD-10-CM | POA: Diagnosis present

## 2022-11-16 DIAGNOSIS — O99891 Other specified diseases and conditions complicating pregnancy: Secondary | ICD-10-CM | POA: Insufficient documentation

## 2022-11-16 DIAGNOSIS — F1920 Other psychoactive substance dependence, uncomplicated: Secondary | ICD-10-CM

## 2022-11-16 DIAGNOSIS — O99321 Drug use complicating pregnancy, first trimester: Secondary | ICD-10-CM | POA: Insufficient documentation

## 2022-11-16 LAB — URINALYSIS, ROUTINE W REFLEX MICROSCOPIC
Bilirubin Urine: NEGATIVE
Glucose, UA: NEGATIVE mg/dL
Hgb urine dipstick: NEGATIVE
Ketones, ur: NEGATIVE mg/dL
Nitrite: POSITIVE — AB
Protein, ur: 30 mg/dL — AB
Specific Gravity, Urine: 1.015 (ref 1.005–1.030)
WBC, UA: >50 WBC/hpf (ref 0–5)
pH: 6 (ref 5.0–8.0)

## 2022-11-16 NOTE — Progress Notes (Signed)
TED hose applied bilaterally to lower extremities

## 2022-11-16 NOTE — MAU Note (Addendum)
.  Rita Trujillo is a 27 y.o. at Unknown here in MAU reporting: unsure when her due date is thinks she I about 6 months. Has  appointment with [facility] on Monday.Concerned because she is having swelling in her legs. Denies any abd pain or cramping . No vag discharge or bleeding LMP: unknown Onset of complaint: 3 weeks Pain score: 0 Vitals:   11/16/22 0927  BP: 130/79  Pulse: 100  Resp: 18  Temp: 98.3 F (36.8 C)     FHT:150 Lab orders placed from triage:  u/a

## 2022-11-16 NOTE — MAU Provider Note (Signed)
Chief Complaint: Leg Swelling  SUBJECTIVE HPI: Rita Trujillo is a 27 y.o. G3P1102 at unknown gestation (thinks ~6 months) who presents for lower extremity swelling.  Notes having swollen feet and ankles frequently. Has noticed marked worsening over past 3 weeks to her whole calf being swollen and difficult to move. Has difficulty being able to sit and prop up legs. Denies fever/chills, drainage from legs, pain, personal/family hx of clotting disorders, chest pain. Notes shortness of breath that is at her baseline from smoking. Notes she injects heroin into her neck and never in her feet/legs.  Pt googled symptoms and worried that she may have preE. Denies swelling in hands/face, HA, vision changes, RUQ pain, any hx of HTN.  +FM. No LOF, VB. Has intake appointment with [facility] on Monday. Last used heroin 9/19. Is getting re-established with methadone clinic. Now has stable transportation with medicaid cab, so that she'll be able to go daily.  HPI  Past Medical History:  Diagnosis Date   Hepatitis C    History reviewed. No pertinent surgical history. Social History   Socioeconomic History   Marital status: Single    Spouse name: Not on file   Number of children: Not on file   Years of education: Not on file   Highest education level: Not on file  Occupational History   Not on file  Tobacco Use   Smoking status: Every Day    Current packs/day: 0.50    Types: Cigarettes   Smokeless tobacco: Never  Vaping Use   Vaping status: Never Used  Substance and Sexual Activity   Alcohol use: No   Drug use: Not Currently    Types: IV, Cocaine    Comment: cocaine, planning to restart on Methadone - current using heroin, last used 11/15/2022   Sexual activity: Not on file  Other Topics Concern   Not on file  Social History Narrative   ** Merged History Encounter **       Social Determinants of Health   Financial Resource Strain: Not on file  Food Insecurity: Not on file  Transportation  Needs: Not on file  Physical Activity: Not on file  Stress: Not on file  Social Connections: Not on file  Intimate Partner Violence: Not on file   No current facility-administered medications on file prior to encounter.   Current Outpatient Medications on File Prior to Encounter  Medication Sig Dispense Refill   acetaminophen (TYLENOL) 325 MG tablet Take 2 tablets (650 mg total) by mouth every 4 (four) hours as needed for moderate pain (for pain scale < 4). 60 tablet 3   ibuprofen (ADVIL) 600 MG tablet Take 1 tablet (600 mg total) by mouth every 6 (six) hours as needed for moderate pain or cramping. 60 tablet 4   Prenatal MV & Min w/FA-DHA (PRENATAL GUMMIES PO) Take 1 tablet by mouth daily.     Allergies  Allergen Reactions   Amoxicillin Anaphylaxis, Hives and Itching   Penicillin V Anaphylaxis, Hives and Itching   Penicillins Anaphylaxis, Hives and Itching    ROS:  Pertinent positives/negatives listed above.  I have reviewed patient's Past Medical Hx, Surgical Hx, Family Hx, Social Hx, medications and allergies.   Physical Exam  Patient Vitals for the past 24 hrs:  BP Temp Pulse Resp Height Weight  11/16/22 0927 130/79 98.3 F (36.8 C) 100 18 5\' 5"  (1.651 m) 76.2 kg   Constitutional: Well-developed, thin female in no acute distress Cardiovascular: normal rate Respiratory: normal effort GI: Abd  soft, non-tender MS: 3-4+ pitting edema b/l. Edema up to thighs. Able to move ankles/knees/hip joints but feels stiff. No erythema, drainage, fluctuance on legs/feet. Homan's sign negative. No cords Neurologic: Alert and oriented x 4.   FHT: 150  LAB RESULTS No results found for this or any previous visit (from the past 24 hour(s)).    IMAGING No results found.  MAU Management/MDM: Orders Placed This Encounter  Procedures   Urinalysis, Routine w reflex microscopic -Urine, Clean Catch   Knee-High 20-30 mmHg Compression Stocking   Discharge patient    No orders of the  defined types were placed in this encounter.   Patient's exam is remarkable for significant pitting edema bilaterally. She does not show signs/symptoms of DVT, cellulitis, abscess, heart failure, preE. Clinical picture consistent with dependent edema significantly worsened by pregnancy. Conservative measures given along with education. Additionally obtained compression stockings for patient to use.  Patient has intake OB visit at [facility] next Monday, so initial OB labs not ordered. Confirmed she has transportation to this. Additionally advised to not use substances during pregnancy. Patient is re-establishing with methadone clinic. Has stable housing currently.  ASSESSMENT 1. Dependent edema   2. Polysubstance dependence including opioid drug with daily use (HCC)   3. Pregnancy with fetus of unknown gestational age     PLAN Discharge home with strict return precautions. Allergies as of 11/16/2022       Reactions   Amoxicillin Anaphylaxis, Hives, Itching   Penicillin V Anaphylaxis, Hives, Itching   Penicillins Anaphylaxis, Hives, Itching        Medication List     STOP taking these medications    ibuprofen 600 MG tablet Commonly known as: ADVIL       TAKE these medications    acetaminophen 325 MG tablet Commonly known as: Tylenol Take 2 tablets (650 mg total) by mouth every 4 (four) hours as needed for moderate pain (for pain scale < 4).   PRENATAL GUMMIES PO Take 1 tablet by mouth daily.         Wylene Simmer, MD OB Fellow 11/16/2022  10:26 AM

## 2022-11-21 LAB — AMB RESULTS CONSOLE CBG: Glucose: 92

## 2022-11-21 NOTE — Progress Notes (Signed)
Pt declined SDOH screening. Pt reccommended to go to OB as soon as possible due to swelling in legs.

## 2022-12-20 ENCOUNTER — Encounter: Payer: Self-pay | Admitting: *Deleted

## 2022-12-20 NOTE — Progress Notes (Addendum)
Pt attended 11/21/22 screening event where her b/p was 127/83 and her blood sugar was 92. At the event, the pt identified [facility] as her PCP and did not identify any SDOH. However, chart review indicates pt was admitted to the hospital where she delivered her baby 12/23/22 and is currently still a patient on the [facility] unit.Since pt is currently hospitalized with SW/CM resources available, the health equity team will defer this initial f/u to the immediate care givers and OB service pt noted was her PCP and will f/u with pt post discharge per health equity f/u scheduled protocol.

## 2022-12-23 ENCOUNTER — Other Ambulatory Visit: Payer: Self-pay

## 2022-12-23 ENCOUNTER — Inpatient Hospital Stay (HOSPITAL_COMMUNITY)
Admission: AD | Admit: 2022-12-23 | Discharge: 2022-12-25 | DRG: 806 | Disposition: A | Discharge status: Home / Self Care | Payer: MEDICAID | Attending: Obstetrics and Gynecology | Admitting: Obstetrics and Gynecology

## 2022-12-23 ENCOUNTER — Encounter (HOSPITAL_COMMUNITY): Payer: Self-pay | Admitting: Obstetrics and Gynecology

## 2022-12-23 DIAGNOSIS — O0930 Supervision of pregnancy with insufficient antenatal care, unspecified trimester: Secondary | ICD-10-CM

## 2022-12-23 DIAGNOSIS — Z3A00 Weeks of gestation of pregnancy not specified: Secondary | ICD-10-CM

## 2022-12-23 DIAGNOSIS — Z833 Family history of diabetes mellitus: Secondary | ICD-10-CM

## 2022-12-23 DIAGNOSIS — F1120 Opioid dependence, uncomplicated: Secondary | ICD-10-CM | POA: Diagnosis present

## 2022-12-23 DIAGNOSIS — Z825 Family history of asthma and other chronic lower respiratory diseases: Secondary | ICD-10-CM

## 2022-12-23 DIAGNOSIS — O9A22 Injury, poisoning and certain other consequences of external causes complicating childbirth: Secondary | ICD-10-CM | POA: Diagnosis present

## 2022-12-23 DIAGNOSIS — O1413 Severe pre-eclampsia, third trimester: Secondary | ICD-10-CM

## 2022-12-23 DIAGNOSIS — O1415 Severe pre-eclampsia, complicating the puerperium: Secondary | ICD-10-CM | POA: Diagnosis not present

## 2022-12-23 DIAGNOSIS — Z818 Family history of other mental and behavioral disorders: Secondary | ICD-10-CM | POA: Diagnosis not present

## 2022-12-23 DIAGNOSIS — O1414 Severe pre-eclampsia complicating childbirth: Secondary | ICD-10-CM | POA: Diagnosis not present

## 2022-12-23 DIAGNOSIS — S0083XA Contusion of other part of head, initial encounter: Secondary | ICD-10-CM | POA: Diagnosis present

## 2022-12-23 DIAGNOSIS — F17210 Nicotine dependence, cigarettes, uncomplicated: Secondary | ICD-10-CM | POA: Diagnosis present

## 2022-12-23 DIAGNOSIS — Z5982 Transportation insecurity: Secondary | ICD-10-CM | POA: Diagnosis not present

## 2022-12-23 DIAGNOSIS — O0933 Supervision of pregnancy with insufficient antenatal care, third trimester: Principal | ICD-10-CM

## 2022-12-23 DIAGNOSIS — O99324 Drug use complicating childbirth: Principal | ICD-10-CM | POA: Diagnosis present

## 2022-12-23 DIAGNOSIS — S0012XA Contusion of left eyelid and periocular area, initial encounter: Secondary | ICD-10-CM | POA: Diagnosis present

## 2022-12-23 DIAGNOSIS — Z5900 Homelessness unspecified: Secondary | ICD-10-CM

## 2022-12-23 DIAGNOSIS — O26893 Other specified pregnancy related conditions, third trimester: Secondary | ICD-10-CM | POA: Diagnosis present

## 2022-12-23 DIAGNOSIS — O99334 Smoking (tobacco) complicating childbirth: Secondary | ICD-10-CM | POA: Diagnosis present

## 2022-12-23 DIAGNOSIS — F1190 Opioid use, unspecified, uncomplicated: Secondary | ICD-10-CM | POA: Diagnosis present

## 2022-12-23 LAB — CBC WITH DIFFERENTIAL/PLATELET
Abs Immature Granulocytes: 0.06 10*3/uL (ref 0.00–0.07)
Basophils Absolute: 0 10*3/uL (ref 0.0–0.1)
Basophils Relative: 0 %
Eosinophils Absolute: 0 10*3/uL (ref 0.0–0.5)
Eosinophils Relative: 0 %
HCT: 39.7 % (ref 36.0–46.0)
Hemoglobin: 12.5 g/dL (ref 12.0–15.0)
Immature Granulocytes: 1 %
Lymphocytes Relative: 20 %
Lymphs Abs: 2.4 10*3/uL (ref 0.7–4.0)
MCH: 23.1 pg — ABNORMAL LOW (ref 26.0–34.0)
MCHC: 31.5 g/dL (ref 30.0–36.0)
MCV: 73.2 fL — ABNORMAL LOW (ref 80.0–100.0)
Monocytes Absolute: 0.5 10*3/uL (ref 0.1–1.0)
Monocytes Relative: 4 %
Neutro Abs: 8.9 10*3/uL — ABNORMAL HIGH (ref 1.7–7.7)
Neutrophils Relative %: 75 %
Platelets: 254 10*3/uL (ref 150–400)
RBC: 5.42 MIL/uL — ABNORMAL HIGH (ref 3.87–5.11)
RDW: 13.8 % (ref 11.5–15.5)
WBC: 11.9 10*3/uL — ABNORMAL HIGH (ref 4.0–10.5)
nRBC: 0 % (ref 0.0–0.2)

## 2022-12-23 LAB — TYPE AND SCREEN
ABO/RH(D): A POS
Antibody Screen: NEGATIVE

## 2022-12-23 LAB — COMPREHENSIVE METABOLIC PANEL
ALT: 28 U/L (ref 0–44)
AST: 43 U/L — ABNORMAL HIGH (ref 15–41)
Albumin: 2.3 g/dL — ABNORMAL LOW (ref 3.5–5.0)
Alkaline Phosphatase: 295 U/L — ABNORMAL HIGH (ref 38–126)
Anion gap: 12 (ref 5–15)
BUN: 5 mg/dL — ABNORMAL LOW (ref 6–20)
CO2: 23 mmol/L (ref 22–32)
Calcium: 9.1 mg/dL (ref 8.9–10.3)
Chloride: 100 mmol/L (ref 98–111)
Creatinine, Ser: 0.59 mg/dL (ref 0.44–1.00)
GFR, Estimated: >60 mL/min (ref >60)
Glucose, Bld: 85 mg/dL (ref 70–99)
Potassium: 3.4 mmol/L — ABNORMAL LOW (ref 3.5–5.1)
Sodium: 135 mmol/L (ref 135–145)
Total Bilirubin: 0.4 mg/dL (ref 0.3–1.2)
Total Protein: 6.5 g/dL (ref 6.5–8.1)

## 2022-12-23 LAB — RAPID URINE DRUG SCREEN, HOSP PERFORMED
Amphetamines: NOT DETECTED
Barbiturates: NOT DETECTED
Benzodiazepines: NOT DETECTED
Cocaine: NOT DETECTED
Opiates: NOT DETECTED
Tetrahydrocannabinol: NOT DETECTED

## 2022-12-23 LAB — RAPID HIV SCREEN (HIV 1/2 AB+AG)
HIV 1/2 Antibodies: NONREACTIVE
HIV-1 P24 Antigen - HIV24: NONREACTIVE

## 2022-12-23 LAB — HEPATITIS B SURFACE ANTIGEN: Hepatitis B Surface Ag: NONREACTIVE

## 2022-12-23 MED ORDER — SOD CITRATE-CITRIC ACID 500-334 MG/5ML PO SOLN: 30.0000 mL | ORAL | Status: DC | PRN

## 2022-12-23 MED ORDER — ONDANSETRON HCL 4 MG/2ML IJ SOLN: 4.0000 mg | INTRAMUSCULAR | Status: DC | PRN

## 2022-12-23 MED ORDER — FENTANYL CITRATE (PF) 100 MCG/2ML IJ SOLN
INTRAMUSCULAR | Status: AC
  Filled 2022-12-23: qty 4

## 2022-12-23 MED ORDER — LEVETIRACETAM 500 MG PO TABS
1000.0000 mg | ORAL_TABLET | Freq: Two times a day (BID) | ORAL | Status: DC
  Administered 2022-12-23 – 2022-12-24 (×4): 1000 mg via ORAL
  Filled 2022-12-23 (×7): qty 2

## 2022-12-23 MED ORDER — ACETAMINOPHEN 325 MG PO TABS: 650.0000 mg | ORAL_TABLET | ORAL | Status: DC | PRN

## 2022-12-23 MED ORDER — OXYCODONE-ACETAMINOPHEN 5-325 MG PO TABS: 1.0000 | ORAL_TABLET | ORAL | Status: DC | PRN

## 2022-12-23 MED ORDER — METHYLERGONOVINE MALEATE 0.2 MG/ML IJ SOLN
INTRAMUSCULAR | Status: AC
  Filled 2022-12-23: qty 3

## 2022-12-23 MED ORDER — SENNOSIDES-DOCUSATE SODIUM 8.6-50 MG PO TABS: 2.0000 | ORAL_TABLET | Freq: Every day | ORAL | Status: DC

## 2022-12-23 MED ORDER — OXYCODONE-ACETAMINOPHEN 5-325 MG PO TABS: 2.0000 | ORAL_TABLET | ORAL | Status: DC | PRN

## 2022-12-23 MED ORDER — LIDOCAINE HCL (PF) 1 % IJ SOLN: 30.0000 mL | INTRAMUSCULAR | Status: DC | PRN

## 2022-12-23 MED ORDER — IBUPROFEN 600 MG PO TABS
600.0000 mg | ORAL_TABLET | Freq: Four times a day (QID) | ORAL | Status: DC
  Administered 2022-12-23 – 2022-12-25 (×9): 600 mg via ORAL
  Filled 2022-12-23 (×11): qty 1

## 2022-12-23 MED ORDER — DIPHENHYDRAMINE HCL 25 MG PO CAPS: 25.0000 mg | ORAL_CAPSULE | Freq: Four times a day (QID) | ORAL | Status: DC | PRN

## 2022-12-23 MED ORDER — CARBOPROST TROMETHAMINE 250 MCG/ML IM SOLN
INTRAMUSCULAR | Status: AC
  Filled 2022-12-23: qty 1

## 2022-12-23 MED ORDER — LIDOCAINE HCL (PF) 2 % IJ SOLN
1.0000 mL | INTRAMUSCULAR | Status: DC | PRN
  Filled 2022-12-23: qty 1

## 2022-12-23 MED ORDER — NIFEDIPINE ER OSMOTIC RELEASE 30 MG PO TB24
30.0000 mg | ORAL_TABLET | Freq: Every day | ORAL | Status: DC
  Administered 2022-12-23 – 2022-12-24 (×2): 30 mg via ORAL
  Filled 2022-12-23 (×2): qty 1

## 2022-12-23 MED ORDER — NIFEDIPINE 10 MG PO CAPS
10.0000 mg | ORAL_CAPSULE | ORAL | Status: DC | PRN
  Administered 2022-12-23: 10 mg via ORAL
  Filled 2022-12-23: qty 1

## 2022-12-23 MED ORDER — NIFEDIPINE 10 MG PO CAPS
20.0000 mg | ORAL_CAPSULE | ORAL | Status: DC | PRN
  Administered 2022-12-23: 20 mg via ORAL

## 2022-12-23 MED ORDER — NIFEDIPINE 10 MG PO CAPS
20.0000 mg | ORAL_CAPSULE | ORAL | Status: DC | PRN
  Filled 2022-12-23: qty 2

## 2022-12-23 MED ORDER — BENZOCAINE-MENTHOL 20-0.5 % EX AERO: 1.0000 | INHALATION_SPRAY | CUTANEOUS | Status: DC | PRN

## 2022-12-23 MED ORDER — OXYTOCIN-SODIUM CHLORIDE 30-0.9 UT/500ML-% IV SOLN: 2.5000 [IU]/h | INTRAVENOUS | Status: DC

## 2022-12-23 MED ORDER — OXYTOCIN BOLUS FROM INFUSION: 333.0000 mL | Freq: Once | INTRAVENOUS | Status: DC

## 2022-12-23 MED ORDER — LACTATED RINGERS IV SOLN: INTRAVENOUS | Status: DC

## 2022-12-23 MED ORDER — COCONUT OIL OIL: 1.0000 | TOPICAL_OIL | Status: DC | PRN

## 2022-12-23 MED ORDER — ONDANSETRON HCL 4 MG/2ML IJ SOLN: 4.0000 mg | Freq: Four times a day (QID) | INTRAMUSCULAR | Status: DC | PRN

## 2022-12-23 MED ORDER — TETANUS-DIPHTH-ACELL PERTUSSIS 5-2.5-18.5 LF-MCG/0.5 IM SUSY: 0.5000 mL | PREFILLED_SYRINGE | Freq: Once | INTRAMUSCULAR | Status: DC

## 2022-12-23 MED ORDER — MAGNESIUM SULFATE 50 % IJ SOLN
5.0000 g | INTRAMUSCULAR | Status: DC
  Filled 2022-12-23 (×4): qty 10

## 2022-12-23 MED ORDER — WITCH HAZEL-GLYCERIN EX PADS: 1.0000 | MEDICATED_PAD | CUTANEOUS | Status: DC | PRN

## 2022-12-23 MED ORDER — PRENATAL MULTIVITAMIN CH
1.0000 | ORAL_TABLET | Freq: Every day | ORAL | Status: DC
  Administered 2022-12-23: 1 via ORAL
  Filled 2022-12-23: qty 1

## 2022-12-23 MED ORDER — OXYTOCIN 10 UNIT/ML IJ SOLN
INTRAMUSCULAR | Status: AC
  Filled 2022-12-23: qty 1

## 2022-12-23 MED ORDER — MAGNESIUM SULFATE 50 % IJ SOLN
10.0000 g | Freq: Once | INTRAMUSCULAR | Status: DC
  Filled 2022-12-23 (×2): qty 20

## 2022-12-23 MED ORDER — LABETALOL HCL 5 MG/ML IV SOLN: 40.0000 mg | INTRAVENOUS | Status: DC | PRN

## 2022-12-23 MED ORDER — FUROSEMIDE 20 MG PO TABS
20.0000 mg | ORAL_TABLET | Freq: Two times a day (BID) | ORAL | Status: DC
  Administered 2022-12-23 – 2022-12-25 (×4): 20 mg via ORAL
  Filled 2022-12-23 (×4): qty 1

## 2022-12-23 MED ORDER — NALOXONE HCL 0.4 MG/ML IJ SOLN: 0.4000 mg | INTRAMUSCULAR | Status: DC | PRN

## 2022-12-23 MED ORDER — LACTATED RINGERS IV SOLN: 500.0000 mL | INTRAVENOUS | Status: DC | PRN

## 2022-12-23 MED ORDER — OXYTOCIN 10 UNIT/ML IJ SOLN
INTRAMUSCULAR | Status: AC
  Administered 2022-12-23: 10 [IU]
  Filled 2022-12-23: qty 1

## 2022-12-23 MED ORDER — ZOLPIDEM TARTRATE 5 MG PO TABS: 5.0000 mg | ORAL_TABLET | Freq: Every evening | ORAL | Status: DC | PRN

## 2022-12-23 MED ORDER — SIMETHICONE 80 MG PO CHEW: 80.0000 mg | CHEWABLE_TABLET | ORAL | Status: DC | PRN

## 2022-12-23 MED ORDER — MISOPROSTOL 200 MCG PO TABS
ORAL_TABLET | ORAL | Status: AC
  Administered 2022-12-23: 1000 ug
  Filled 2022-12-23: qty 5

## 2022-12-23 MED ORDER — DIBUCAINE (PERIANAL) 1 % EX OINT: 1.0000 | TOPICAL_OINTMENT | CUTANEOUS | Status: DC | PRN

## 2022-12-23 MED ORDER — ONDANSETRON HCL 4 MG PO TABS: 4.0000 mg | ORAL_TABLET | ORAL | Status: DC | PRN

## 2022-12-23 MED ORDER — NALOXONE HCL 2 MG/2ML IJ SOSY
2.0000 mg | PREFILLED_SYRINGE | INTRAMUSCULAR | Status: DC | PRN
  Filled 2022-12-23: qty 2

## 2022-12-23 NOTE — Lactation Note (Signed)
This note was copied from a baby's chart.  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 Keep's feeding choice on admission is going to be formula. Per provider's H&P mother's milk cannot be used at this time due to illicit opioid use. Lactation services are completed at this time.  OBJECTIVE Infant data: Mother's Current Feeding Choice: Formula  O2 Device: HHFNC O2 Flow Rate (L/min): 4 L/min FiO2 (%): 21 %  Infant feeding assessment Scale for Readiness: 2   Maternal data: L2G4010 Vaginal, Spontaneous Current breast feeding challenges:: NICU admission Risk factor for low/delayed milk supply:: PPH of 1734 cc, infant separation  ASSESSMENT Infant: Feeding Status: Scheduled 9-12-3-6 Feeding method: Tube/Gavage (Bolus)  Maternal: No data recorded  INTERVENTIONS/PLAN Interventions: No data recorded Plan: Consult Status: Complete   Courteny Egler S Mancel Lardizabal 12/23/2022, 5:47 PM

## 2022-12-23 NOTE — Progress Notes (Signed)
IV team attempt x2 unsuccessful

## 2022-12-23 NOTE — Progress Notes (Signed)
Patient informed of the need to collect urine specimen per MD orders. This RN instructed the patient to call for help when getting up to the bathroom. Patient confirmed understanding.

## 2022-12-23 NOTE — Progress Notes (Signed)
Pharmacy was contacted regarding the use of Keppra IM for seizure px. This patient currently does not have IV access and IV Keppra is an alternative treatment option when IV magnesium cannot be used for seizure px. Unfortunately, all formulations of Keppra available for IV use that we have on site are not recommended to be administered IM. At this time, we recommended starting PO Keppra until IV access is obtained because Keppra has 100% bioavailability when given IV or PO and the patient is able to take oral medications. IM magnesium was already ordered as well and was not given. IV and IM magnesium are recommended as first line treatment options for seizure px in the ACOG guidelines.  Thank you for involving pharmacy in this patient's care, Fredirick Lathe, PharmD, MSPH, BCPPS

## 2022-12-23 NOTE — MAU Note (Signed)
Pt arrived via EMS in active labor-when call was made to unit-routine traffic-EMS stated pts condition changed quickly and began pushing at registration.  Pt has not received prenatal care.and stated she was currently homeless.   Pt reports she had been in Methadone clinic and was kicked out-so she has been taking a friends methadone and supplementing with heroin.  1610   LD charge RN, NICU charge RN, OB rapid response RN called for imminent delivery

## 2022-12-23 NOTE — Discharge Summary (Signed)
Postpartum Discharge Summary  Date of Service updated***     Patient Name: Rita Trujillo DOB: [birth year] MRN: [id number]  Date of admission: 12/23/2022 Delivery date:12/23/2022 Delivering provider: Sharen Counter A Date of discharge: 12/23/2022  Admitting diagnosis: Normal labor [O80, Z37.9] Intrauterine pregnancy: Unknown     Secondary diagnosis:  Principal Problem:   NSVD (normal spontaneous vaginal delivery) Active Problems:   Heroin dependence (HCC)   No prenatal care in current pregnancy   Methadone use  Additional problems: *** None   Discharge diagnosis: Term Pregnancy Delivered and Preeclampsia (severe)                                              Post partum procedures:{Postpartum procedures:23558} Augmentation: N/A Complications: None  Hospital course: Onset of Labor With Vaginal Delivery      27 y.o. yo [id number] at Unknown was admitted in Active Labor on 12/23/2022. Labor course was complicated by precipitous labor with delivery in MAU.  Membrane Rupture Time/Date: 5:17 AM,12/23/2022  Delivery Method:Vaginal, Spontaneous Operative Delivery:N/A Episiotomy: None Lacerations:  None Patient had a postpartum course complicated by ***.  She is ambulating, tolerating a regular diet, passing flatus, and urinating well. Patient is discharged home in stable condition on 12/23/22.  Newborn Data: Birth date:12/23/2022 Birth time:5:24 AM Gender:Female Living status:Living Apgars:8 ,9  Weight:3210 g  Magnesium Sulfate received: No BMZ received: No Rhophylac:No MMR:No T-DaP:{Tdap:23962} offer prior to discharge Flu: {EXB:28413}  offer prior to discharge RSV Vaccine received: No  Transfusion:No  Immunizations received: Immunization History  Administered Date(s) Administered   Tdap 03/01/2014    Physical exam  Vitals:   12/23/22 0801 12/23/22 0816 12/23/22 0837 12/23/22 1000  BP: (!) 150/94 135/67 129/75 130/73  Pulse: 91 (!) 143 (!) 124 (!) 125   Resp:   18 17  Temp:   97.8 F (36.6 C) 99.2 F (37.3 C)  TempSrc:   Oral Oral  SpO2:   100% 100%   General: {Exam; general:21111117} Lochia: {Desc; appropriate/inappropriate:30686::"appropriate"} Uterine Fundus: {Desc; firm/soft:30687} Incision: {Exam; incision:21111123} DVT Evaluation: {Exam; KGM:0102725} Labs: Lab Results  Component Value Date   WBC 15.5 (H) 12/09/2020   HGB 12.3 12/09/2020   HCT 36.8 12/09/2020   MCV 81.4 12/09/2020   PLT 283 12/09/2020      Latest Ref Rng & Units 04/19/2014    5:00 AM  CMP  Glucose 70 - 99 mg/dL 366   BUN 6 - 23 mg/dL 6   Creatinine 4.40 - 3.47 mg/dL 4.25   Sodium 956 - 387 mmol/L 133   Potassium 3.5 - 5.1 mmol/L 3.5   Chloride 96 - 112 mmol/L 105   CO2 19 - 32 mmol/L 22   Calcium 8.4 - 10.5 mg/dL 8.7   Total Protein 6.0 - 8.3 g/dL 6.1   Total Bilirubin 0.3 - 1.2 mg/dL 0.7   Alkaline Phos 47 - 119 U/L 170   AST 0 - 37 U/L 24   ALT 0 - 35 U/L 23    Edinburgh Score:    12/23/2022    8:38 AM  Edinburgh Postnatal Depression Scale Screening Tool  I have been able to laugh and see the funny side of things. --   No data recorded  After visit meds:  Allergies as of 12/23/2022       Reactions   Amoxicillin Anaphylaxis, Hives, Itching  Penicillin V Anaphylaxis, Hives, Itching   Penicillins Anaphylaxis, Hives, Itching     Med Rec must be completed prior to using this [facility]***        Discharge home in stable condition Infant Feeding: {Baby feeding:23562} Infant Disposition:{CHL IP OB HOME WITH WGNFAO:13086} Discharge instruction: per After Visit Summary and Postpartum booklet. Activity: Advance as tolerated. Pelvic rest for 6 weeks.  Diet: {OB VHQI:69629528} Future Appointments:No future appointments. Follow up Visit:  Message sent to [facility] on 12/23/22:   Please schedule this patient for a In person postpartum visit in 6 weeks with the following provider:  MD, Crissie Reese if possible . Additional Postpartum  F/U:Postpartum Depression checkup and BP check 2-3 days  High risk pregnancy complicated by: HTN and SUD no prenatal care Delivery mode:  Vaginal, Spontaneous Anticipated Birth Control:  Considering IUD outpatient   12/23/2022 Sharen Counter, CNM

## 2022-12-23 NOTE — H&P (Addendum)
Rita Trujillo is a 27 y.o. female presenting for active labor in MAU which progressed quickly to a SVD (see delivery note for more details).  No prenatal care.  Patient is currently homeless.  She stated she had been in methadone clinic but was discharged from this clinic; she is currently taking a friend's methadone and supplementing with heroin.  During MAU encounter, she was noted to have severe range BPs which required initiation of oral Nifedipine protocol.  Her BP stabilized after two doses of Nifedipine.  Attempts were made to get IV access by Anesthesia and IV teams and they were unsuccessful.  No indication for PICC line at this point, so decision was made to proceed with IM Magnesium sulfate for eclampsia prophylaxis.  Patient is having moderate lochia and mild pain after delivery, no other concerns.  Infant was taken to NICU.  OB History     Gravida  3   Para  3   Term  1   Preterm  1   AB  0   Living  3      SAB  0   IAB  0   Ectopic  0   Multiple  0   Live Births  3          Past Medical History:  Diagnosis Date   Hepatitis C    History reviewed. No pertinent surgical history. Family History: family history includes ADD / ADHD in her father; Bipolar disorder in her father; COPD in her mother; Cancer in her maternal aunt and maternal grandmother; Diabetes in her maternal grandmother. Social History:  reports that she has been smoking cigarettes. She has never used smokeless tobacco. She reports current drug use. Drugs: IV and Cocaine. She reports that she does not drink alcohol.   Review of Systems History   Blood pressure 129/75, pulse (!) 124, temperature 97.8 F (36.6 C), temperature source Oral, resp. rate 18, last menstrual period 12/27/2021, SpO2 100%, unknown if currently breastfeeding. Exam Physical Exam  CONSTITUTIONAL: No acute distress.  HENT:  Normocephalic, atraumatic, External right and left ear normal EYES: Conjunctivae and EOM are normal.  No scleral icterus.  NECK: Normal range of motion, supple, no masses SKIN: Skin is warm and dry. Multiple skin lesions noted. Not diaphoretic. No erythema. No pallor. NEUROLOGIC: Alert and oriented to person, place, and time. Normal reflexes, muscle tone coordination.  PSYCHIATRIC: Normal mood and affect. Normal behavior. Normal judgment and thought content. CARDIOVASCULAR: Elevated heart rate noted, regular rhythm RESPIRATORY: Effort and breath sounds normal, no problems with respiration noted ABDOMEN: Soft, nontender, nondistended, fundus firm MUSCULOSKELETAL: Normal range of motion. No edema and no tenderness. 2+ distal pulses.  Care turned to Dr. Macon Large at this point.  Leftwich-Kirby, Wilmer Floor CNM     Attestation of Attending Supervision of Advanced Practice Provider (CNM/NP/PA): Evaluation and management procedures were performed by the Advanced Practice Provider under my supervision and collaboration.  I have reviewed the Advanced Practice Provider's note and chart, and I agree with the management and plan. I have also made any necessary editorial changes.  Assessment/Plan: Admit to [facility] for postpartum care and management Nifedipine XR 30 mg daily and Lasix 20 mg bo bid ordered for BP control for now, can titrate if needed. No IV access, no indication for PICC line and concerned about reported IVDU.  Advised patient that she will get magnesium sulfate eclampsia prophylaxis via IM route: 10 mg loading, then 5 g q4h x 24 hours. Follow up UDS,  and monitor COWS scores and can give Methadone as needed.  Patient does not want to be switched to Suboxone at this time.   She is formula feeding, unknown contraception at this point. Routine postpartum care.  Jaynie Collins, MD 12/23/2022, 9:14 AM

## 2022-12-23 NOTE — Progress Notes (Addendum)
Patient not present in room since 1900 per nurse secretary. This RN called Ms. Floor's mobile phone listed in EPIC and left a voicemail.   Dr. Berton Lan aware.

## 2022-12-23 NOTE — Progress Notes (Signed)
Patient had been instructed to call for help when she got OOB and that we need to collect urine specimen for ordered labwork. Patient got out of bed without assistance and stated she forgot to obtain urine specimen. Reminded her of importance of collecting specimen the next time she needs to void and she verbalized understanding.

## 2022-12-23 NOTE — Progress Notes (Signed)
Third attempt by IV team unsuccessful; RN and NM aware. Pt is calm and resting quietly with no complaints at this time.

## 2022-12-24 LAB — RPR: RPR Ser Ql: NONREACTIVE

## 2022-12-24 LAB — RUBELLA SCREEN: Rubella: 1.7 {index} (ref >0.99)

## 2022-12-24 MED ORDER — POTASSIUM CHLORIDE CRYS ER 20 MEQ PO TBCR: 20.0000 meq | EXTENDED_RELEASE_TABLET | Freq: Every day | ORAL | Status: DC

## 2022-12-24 MED ORDER — POTASSIUM CHLORIDE CRYS ER 20 MEQ PO TBCR
20.0000 meq | EXTENDED_RELEASE_TABLET | Freq: Every day | ORAL | Status: DC
  Administered 2022-12-24: 20 meq via ORAL
  Filled 2022-12-24: qty 1

## 2022-12-24 MED ORDER — SENNOSIDES-DOCUSATE SODIUM 8.6-50 MG PO TABS
2.0000 | ORAL_TABLET | Freq: Every evening | ORAL | Status: DC | PRN
  Filled 2022-12-24: qty 2

## 2022-12-24 MED ORDER — MEDROXYPROGESTERONE ACETATE 150 MG/ML IM SUSP: 150.0000 mg | Freq: Once | INTRAMUSCULAR | Status: DC | PRN

## 2022-12-24 NOTE — Progress Notes (Addendum)
[facility] CPS social workers (Kayla Hazelton and [place]) came to meet with parents and visit infant in the NICU. CPS social workers reported that effective immediately parents have to be supervised by temporary safety provider (Paternal Grandmother: Henrietta Dine; [phone]; [address], [place], [place]) when visiting infant in the NICU. Infant's discharge plan is undecided at this time. CPS to contact CSW with updates regarding infant's discharge plan.   Parents to be supervised in the NICU at all times by temporary safety provider listed above.  Infant's RN, Visual merchandiser, and Security updated.    Celso Sickle, LCSW Clinical Social Worker [facility] Cell#: [phone]

## 2022-12-24 NOTE — Clinical Social Work Maternal (Signed)
CLINICAL SOCIAL WORK MATERNAL/CHILD NOTE  Patient Details  Name: Rita Trujillo MRN: [id number] Date of Birth: 25-Oct-1995  Date:  12/24/2022  Clinical Social Worker Initiating Note:  Celso Sickle, [place] Date/Time: Initiated:  12/24/22/1202     Child's Name:  Unknown   Biological Parents:  Mother, Father (Father: Olen Pel.)   Need for Interpreter:  None   Reason for Referral:  Current Substance Use/Substance Use During Pregnancy  , Current CPS Involvement, Late or No Prenatal Care     Address:  [address] [place] [address]    Phone number:  [phone] (home)     Additional phone number:   Household Members/Support Persons (HM/SP):   Household Member/Support Person 1, Household Member/Support Person 2, Household Member/Support Person 3   HM/SP Name Relationship DOB or Age  HM/SP -1 [employer]. FOB    HM/SP -2   FOB's mom    HM/SP -3 [place] son 12/08/20  HM/SP -4        HM/SP -5        HM/SP -6        HM/SP -7        HM/SP -8          Natural Supports (not living in the home):      Professional Supports: None   Employment: Unemployed   Type of Work:     Education:  Other (comment) (GED)   Homebound arranged:    Surveyor, quantity Resources:  Medicaid   Other Resources:  [facility]   Cultural/Religious Considerations Which May Impact Care:    Strengths:  Ability to meet basic needs  , Optometrist chosen, Home prepared for child  , Understanding of illness   Psychotropic Medications:         Pediatrician:    Armed forces operational officer area  Pediatrician List:   [facility] ([address] [employer])  [place]    [place]      Pediatrician Fax Number:    Risk Factors/Current Problems:  Substance Use  , DHHS Involvement     Cognitive State:  Able to Concentrate  , Alert  , Goal Oriented  , Linear Thinking     Mood/Affect:  Calm  , Comfortable   , Interested     CSW Assessment: CSW met with MOB at bedside to complete psychosocial assessment, FOB present. CSW introduced self and explained role. FOB reported that he was about to go and visit with infant in the NICU and left the room after packing baby bag. MOB was polite and remained engaged during assessment. MOB reported that she resides with FOB, FOB's mother, and her son. MOB reported that her oldest son Theora Gianotti. 04/19/14) resides with his paternal grandmother. MOB reported that they have all items needed to care for infant including a car seat and bassinet. CSW inquired about MOB's support system, MOB reported that FOB is a support.   CSW explained consult reason "substance exposed newborn" in addition to NICU admission. MOB verbalized understanding.   CSW inquired about MOB's mental health history. MOB denied any mental health history and denied any history of postpartum depression. CSW inquired about how MOB was feeling emotionally since giving birth, MOB reported that she was feeling fine. MOB presented calm and did not demonstrate any acute mental health signs/symptoms. CSW assessed for safety, MOB denied SI, HI, and domestic violence.  CSW provided education regarding the baby blues period vs. perinatal mood disorders, discussed treatment and gave resources for mental health follow up if concerns arise.  CSW recommends self-evaluation during the postpartum time period using the New Mom Checklist from Postpartum Progress and encouraged MOB to contact a medical professional if symptoms are noted at any time.    CSW provided review of Sudden Infant Death Syndrome (SIDS) precautions.    CSW and MOB discussed infant's NICU admission. CSW informed MOB about the NICU and supports available while infant is admitted to the NICU. MOB reported that infant's NICU admission seems to be going okay and shared that she feels informed about infant's care. MOB denied any transportation  barriers with visiting infant in the NICU. MOB reported that she utilizes Medicaid transportation, the bus, and [place]. MOB denied any questions/concerns regarding the NICU.   CSW informed MOB about the hospital drug screen policy due to documented substance use during pregnancy. MOB reported that she used Fentanyl and stopped once she found out she was pregnant 2 1/2 months ago. MOB denied any additional substance use and reported that she is currently in treatment at [facility]. MOB reported that she goes daily for methadone dosing and shared that it is helpful. MOB declined needing any additional treatment resources. CSW inquired about heroin use noted in the chart, MOB explained that fentanyl and heroin are the same. CSW informed MOB that infant's UDS was positive for amphetamines and a CPS report would be made. MOB denied using any amphetamines and reported that it was possibly in the fentanyl she used 2 1/2 months ago. MOB denied any questions about the hospital drug screen policy. MOB endorsed CPS history and reported having a current open case with [facility] CPS (Social Worker: Annie Paras) due to someone making a report that they were homeless and sleeping outside. MOB explained that they are staying with FOB's mother and denied any housing concerns. CSW inquired about any needs for resources/supports, MOB denied any needs.   MOB opted to call CSW versus CSW checking in weekly if any needs/concerns arise during infant's NICU admission.   CSW made a [facility] CPS report due to infant's positive UDS and current open CPS case.   Currently there are barriers to infant's discharge. CSW awaiting call from CPS.   CSW Plan/Description:  CSW Awaiting CPS Disposition Plan, CSW Will Continue to Monitor Umbilical Cord Tissue Drug Screen Results and Make Report if Warranted, Child Protective Service Report  , Hospital Drug Screen Policy Information, Sudden Infant Death  Syndrome (SIDS) Education, Perinatal Mood and Anxiety Disorder (PMADs) Education, Psychosocial Support and Ongoing Assessment of Needs    Antionette Poles, LCSW 12/24/2022, 12:20 PM

## 2022-12-24 NOTE — Progress Notes (Addendum)
OB Note I went to speak with patient; she was currently talking to SW and said it was okay for me to talk to her about her care.   [facility] hasn't gone in four days due to transportation issues. She states that she has medicaid transportion already lined up and wants to continue at [facility]. I d/w her our OUD specialist Dr. Crissie Reese and if she'd want to talk to him during her current admission or at the clinic and she declines. Pt amenable to blood draw to follow up labwork. Pt is a hard stick and only able to draw cbc.  She states she has hepC and doesn't have a Liver doctor; when asked if she's interested in getting set up with one, she is ambivalent. I told her that we can follow up her desire at her one week postpartum appointment  Birth control not d/w pt but can follow up later; message sent to the office to see if patient is eligible for inpatient nexplanon.   Cornelia Copa MD Attending Center for [employer] (Faculty Practice) 12/24/2022 Time: [phone]

## 2022-12-24 NOTE — Progress Notes (Signed)
Patient was last seen on the unit around 0730. She is not in her room or on the unit  and did not respond to phone call to number of record by charge nurse. Dr. Vergie Living notified.

## 2022-12-24 NOTE — Progress Notes (Signed)
RN attempted to contact patient via phone and was unable to reach patient. MD and [facility] aware. RN spoke with social work regarding status.

## 2022-12-24 NOTE — Progress Notes (Signed)
Post Partum Day 1 Subjective: Resting comfortably in bed, wakes easily. Reports body aches and feeling cold. Ambulating. Tolerating PO. Voiding spontaneously.  Objective: Blood pressure (!) 105/57, pulse 71, temperature 97.9 F (36.6 C), temperature source Oral, resp. rate 12, last menstrual period 12/27/2021, SpO2 100%, unknown if currently breastfeeding.  Physical Exam:  General: fatigued and no distress Lochia: appropriate Uterine Fundus: firm DVT Evaluation: No evidence of DVT seen on physical exam.  Recent Labs    12/23/22 1347  HGB 12.5  HCT 39.7   Assessment/Plan: Postpartum - Contraception: undecided - MOF: formula - Rh status: Rh+ - Rubella status: pending - Dispo: likely discharge PPD2 - Consults: SW, lactation  Neonatal - In NICU - Circumcision: to be addressed  3. Preeclampsia with SF (BP) - unable to receive mag gtt due to lack of IV access - keppra 1g BID x 5d for seizure ppx - normotensive on procardia 30 daily - continue lasix x 5d PP  4. OUD - UDS pending - COWS q4h, RN to call for scores > 10 - declined suboxone - REACH referral on discharge - has intranasal narcan ordered (no IV access), will need rx on discharge   LOS: 1 day   Rita Trujillo 12/24/2022, 7:51 AM

## 2022-12-24 NOTE — Progress Notes (Signed)
Patient was offered DepoProvera for birth control, but declined.

## 2022-12-24 NOTE — Progress Notes (Signed)
[facility] CPS social work supervisor Early Chars [phone]) contacted CSW and reported that social workers (Kayla Jeanella Craze and [place]) were headed to the hospital to initiate the case with MOB. CPS social work Merchandiser, retail reported that the assigned Child psychotherapist is Futures trader).   Celso Sickle, LCSW Clinical Social Worker [facility] Cell#: [phone]

## 2022-12-24 NOTE — Progress Notes (Signed)
@   4540 - Pt made secretary aware that she would be stepping outside to grab a "pack of cigarettes from a female friend." Pt was asked to obtain item and come right back.  @ [phone]- RN Olegario Messier made aware that pt would be stepping outside and coming right back.

## 2022-12-25 ENCOUNTER — Other Ambulatory Visit (HOSPITAL_COMMUNITY): Payer: Self-pay

## 2022-12-25 LAB — GC/CHLAMYDIA PROBE AMP (~~LOC~~) NOT AT ARMC
Chlamydia: NEGATIVE
Comment: NEGATIVE
Comment: NORMAL
Neisseria Gonorrhea: NEGATIVE

## 2022-12-25 LAB — SURGICAL PATHOLOGY

## 2022-12-25 MED ORDER — LEVETIRACETAM 1000 MG PO TABS
1000.0000 mg | ORAL_TABLET | Freq: Two times a day (BID) | ORAL | 0 refills | Status: DC
  Filled 2022-12-25: qty 6, 3d supply, fill #0

## 2022-12-25 MED ORDER — NIFEDIPINE ER 30 MG PO TB24
30.0000 mg | ORAL_TABLET | Freq: Every day | ORAL | 1 refills | Status: DC
  Filled 2022-12-25: qty 30, 30d supply, fill #0

## 2022-12-25 MED ORDER — NALOXONE HCL 4 MG/0.1ML NA LIQD
4.0000 mg | NASAL | 3 refills | Status: DC | PRN
  Filled 2022-12-25: qty 2, 1d supply, fill #0

## 2022-12-25 MED ORDER — IBUPROFEN 600 MG PO TABS
600.0000 mg | ORAL_TABLET | Freq: Four times a day (QID) | ORAL | 0 refills | Status: DC
  Filled 2022-12-25: qty 30, 8d supply, fill #0

## 2022-12-25 MED ORDER — FUROSEMIDE 20 MG PO TABS
20.0000 mg | ORAL_TABLET | Freq: Two times a day (BID) | ORAL | 0 refills | Status: DC
  Filled 2022-12-25: qty 5, 3d supply, fill #0

## 2022-12-25 NOTE — Consult Note (Signed)
Substance exposed newborn consult complete in mother's hospital room.  Mom noted to have facial bruising under left eye and over right jaw during visit. Asked mom if she would like FOB to be present for conversation and she requested that he remain.  Mom reports methadone management from [place].  Detailed REACH clinic.  Reviewed Eat, working with CPS to determine plan for this infant.  Mom has substance exposed newborn contact information for ongoing concerns or needs.

## 2022-12-25 NOTE — Progress Notes (Signed)
RN notified by security that pt. left in a car with an unknown person. RN stated that pt has not officially been discharged. Dr. Para March notified. RN rounded on room, visitor asleep in bed but pt not in room at this time.

## 2023-01-03 ENCOUNTER — Institutional Professional Consult (permissible substitution): Payer: MEDICAID

## 2023-01-03 ENCOUNTER — Ambulatory Visit: Payer: MEDICAID | Admitting: Family Medicine

## 2023-01-03 NOTE — Progress Notes (Deleted)
    Post Partum Visit Note  Rita Trujillo is a 27 y.o. [phone] female who presents for a postpartum visit. She is {1-10:13787} {time; units:18646} postpartum following a {method of delivery:313099}.  I have fully reviewed the prenatal and intrapartum course. The delivery was at *** gestational weeks.  Anesthesia: {anesthesia types:812}. Postpartum course has been ***. Baby is doing well***. Baby is feeding by {breastmilk/bottle:69}. Bleeding {vag bleed:12292}. Bowel function is {normal:32111}. Bladder function is {normal:32111}. Patient {is/is not:9024} sexually active. Contraception method is {contraceptive method:5051}. Postpartum depression screening: {gen negative/positive:315881}.   The pregnancy intention screening data noted above was reviewed. Potential methods of contraception were discussed. The patient elected to proceed with No data recorded.    Health Maintenance Due  Topic Date Due   COVID-19 Vaccine (1) Never done   HPV VACCINES (1 - 3-dose series) Never done   Cervical Cancer Screening (Pap smear)  Never done   INFLUENZA VACCINE  Never done    {Common ambulatory SmartLinks:19316}  Review of Systems {ros; complete:30496}  Objective:  LMP 12/27/2021 (Approximate)    General:  {gen appearance:16600}   Breasts:  {desc; normal/abnormal/not indicated:14647}  Lungs: {lung exam:16931}  Heart:  {heart exam:5510}  Abdomen: {abdomen exam:16834}   Wound {Wound assessment:11097}  GU exam:  {desc; normal/abnormal/not indicated:14647}       Assessment:    There are no diagnoses linked to this encounter.  *** postpartum exam.   Plan:   Essential components of care per ACOG recommendations:  1.  Mood and well being: Patient with {gen negative/positive:315881} depression screening today. Reviewed local resources for support.  - Patient tobacco use? {tobacco use:25506}  - hx of drug use? {yes/no:25505}    2. Infant care and feeding:  -Patient currently breastmilk  feeding? {yes/no:25502}  -Social determinants of health (SDOH) reviewed in EPIC. No concerns***The following needs were identified***  3. Sexuality, contraception and birth spacing - Patient {DOES_DOES YNW:29562} want a pregnancy in the next year.  Desired family size is {NUMBER 1-10:22536} children.  - Reviewed reproductive life planning. Reviewed contraceptive methods based on pt preferences and effectiveness.  Patient desired {Upstream End Methods:24109} today.   - Discussed birth spacing of 18 months  4. Sleep and fatigue -Encouraged family/partner/community support of 4 hrs of uninterrupted sleep to help with mood and fatigue  5. Physical Recovery  - Discussed patients delivery and complications. She describes her labor as {description:25511} - Patient had a {CHL AMB DELIVERY:[phone]}. Patient had a {laceration:25518} laceration. Perineal healing reviewed. Patient expressed understanding - Patient has urinary incontinence? {yes/no:25515} - Patient {ACTION; IS/IS ZHY:[id number]} safe to resume physical and sexual activity  6.  Health Maintenance - HM due items addressed {Yes or If no, why not?:20788} - Last pap smear No results found for: "DIAGPAP" Pap smear {done:10129} at today's visit.  -Breast Cancer screening indicated? {indicated:25516}  7. Chronic Disease/Pregnancy Condition follow up: {Follow up:25499}  - PCP follow up  Coolidge Breeze, RMA Center for [facility], [facility]

## 2023-01-10 ENCOUNTER — Telehealth (HOSPITAL_COMMUNITY): Payer: Self-pay | Admitting: *Deleted

## 2023-01-10 NOTE — Telephone Encounter (Signed)
01/10/2023  Name: Rita Trujillo MRN: [id number] DOB: 1995/05/10  Reason for Call:  Transition of Care Hospital Discharge Call  Contact Status: Patient Contact Status: Unable to contact  Language assistant needed:          Follow-Up Questions:    Inocente Salles Postnatal Depression Scale:  In the Past 7 Days:    PHQ2-9 Depression Scale:     Discharge Follow-up:    Post-discharge interventions: NA  Maudry Diego, RN 01/10/2023 1102

## 2023-01-14 NOTE — Progress Notes (Signed)
Postpartum substance exposed newborn consult completed on site at [facility].  Mom reports initiation of Subutex treatment on day 4 of her incarceration and feeling well with a desire to continue treatment.  She is aware that she can continue management in REACH clinic through 12 months postpartum and has clinic contact information.  Will follow on site as needed.

## 2023-01-18 ENCOUNTER — Encounter: Payer: Self-pay | Admitting: Family Medicine

## 2023-01-18 ENCOUNTER — Ambulatory Visit: Payer: MEDICAID | Admitting: Family Medicine

## 2023-01-18 DIAGNOSIS — B182 Chronic viral hepatitis C: Secondary | ICD-10-CM

## 2023-01-18 DIAGNOSIS — F1190 Opioid use, unspecified, uncomplicated: Secondary | ICD-10-CM

## 2023-01-18 MED ORDER — BUPRENORPHINE HCL-NALOXONE HCL 8-2 MG SL FILM: 1.0000 | ORAL_FILM | Freq: Two times a day (BID) | SUBLINGUAL | Status: DC

## 2023-01-18 NOTE — Patient Instructions (Signed)
You were seen for a postpartum visit.  You should continue taking suboxone 8 mg twice a day for your opioid use disorder.  For your chronic hepatitis C, I have placed a referral to infectious disease clinic so that you can be evaluated for treatment.   I will see you back in one month for an IUD insertion.

## 2023-01-18 NOTE — Progress Notes (Signed)
    Post Partum Visit Note  Rita Trujillo is a 27 y.o. [phone] female who presents for a postpartum visit. She is 3 weeks postpartum following a normal spontaneous vaginal delivery.  I have fully reviewed the prenatal and intrapartum course. The delivery was at unknown gestational weeks.  Anesthesia: none. Postpartum course has been complicated by NAS for baby and incarceration for mother. Baby is doing well. Bleeding staining only. Bowel function is normal. Bladder function is normal. Patient is not sexually active. Contraception method is abstinence. Postpartum depression screening: negative.   The pregnancy intention screening data noted above was reviewed. Potential methods of contraception were discussed. The patient elected to proceed with No data recorded.    Health Maintenance Due  Topic Date Due   COVID-19 Vaccine (1) Never done   HPV VACCINES (1 - 3-dose series) Never done   Cervical Cancer Screening (Pap smear)  Never done   INFLUENZA VACCINE  Never done    The following portions of the patient's history were reviewed and updated as appropriate: allergies, current medications, past family history, past medical history, past social history, past surgical history, and problem list.  Review of Systems Pertinent items noted in HPI and remainder of comprehensive ROS otherwise negative.  Objective:  Wt 139 lb 5 oz (63.2 kg)   LMP 12/27/2021 (Approximate)   BMI 23.18 kg/m    General:  alert, cooperative, and appears stated age   Breasts:  not indicated  Lungs: Comfortalbe on room air  Wound N/a  GU exam:  not indicated         Assessment:   Postpartum exam  Opioid use disorder  Chronic hepatitis C without hepatic coma (HCC)  Normal postpartum exam.   Plan:   Essential components of care per ACOG recommendations:  1.  Mood and well being: Patient with negative depression screening today. Reviewed local resources for support.  - Patient tobacco use? No.   - hx  of drug use? Yes, see below    2. Infant care and feeding:  -Patient currently breastmilk feeding? No.  -Social determinants of health (SDOH) reviewed in EPIC. No concerns  3. Sexuality, contraception and birth spacing - Patient does not want a pregnancy in the next year.  Desired family size is 4 children.  - Reviewed reproductive life planning. Reviewed contraceptive methods based on pt preferences and effectiveness.  Patient desired nothing today, thinking about IUD in the future.  Scheduled for 1 month follow up - Discussed birth spacing of 18 months  4. Sleep and fatigue -Encouraged family/partner/community support of 4 hrs of uninterrupted sleep to help with mood and fatigue  5. Physical Recovery  - Discussed patients delivery and complications. She describes her labor as mixed. - Patient had a Vaginal, no problems at delivery. Patient had no laceration. Perineal healing reviewed. Patient expressed understanding - Patient has urinary incontinence? No. - Patient is safe to resume physical and sexual activity  6.  Health Maintenance - HM due items addressed Yes - Last pap smear No results found for: "DIAGPAP" Pap smear done at today's visit.  -Breast Cancer screening indicated? No.   7. Chronic Disease/Pregnancy Condition follow up:  Chronic HCV: referred to ID for treatment OUD: stable on suboxone 8 mg BID, will come to see me post incarceration - PCP follow up   Venora Maples, MD, MPH, FAAFP Attending Family Medicine Physician, Faculty Practice Center for [facility], [facility]

## 2023-02-04 ENCOUNTER — Encounter: Payer: Self-pay | Admitting: *Deleted

## 2023-02-04 NOTE — Progress Notes (Signed)
Pt attended 11/21/22 screening event where her b/p was 127/83 and her blood sugar was 92. At the event, the pt listed her OB-GYN [facility] as her PCP and did not identify any SDOH insecurities. In addition, chart review indicates pt delivered a baby boy on 12/23/22 and HIPAA restrictions apply to pt's current post-partum SDOH. Pt did come to 3 week post partum check up by delivering OB provider from [facility], Dr. Crissie Reese on 01/18/23 (no vitals noted in that visit) and has a future appt with him on 03/05/23. Chart notes and media indicate pt may currently be residing at a [employer] address and not at previous home address on file, but health equity team unable to send mail to current residence and unable to contact pt by last known phone number, although VM left to return call for potential support.  Additional pt f/u to be scheduled per health equity protocol.

## 2023-03-05 ENCOUNTER — Ambulatory Visit: Payer: MEDICAID | Admitting: Family Medicine

## 2023-03-13 ENCOUNTER — Telehealth: Payer: Self-pay | Admitting: Family Medicine

## 2023-03-13 NOTE — Telephone Encounter (Signed)
 Notified by [facility] front desk that patient had called asking about a bridge prescription.  I called and spoke with Rita Trujillo who I have not seen since her postpartum visit about two months ago, she was incarcerated at the time. She reports she has been discharged with a bridge prescription that ran out about two days ago. Also reports she is going to [facility] and is requesting additional bridge prescription.   Discussed I'd be OK with that if she can come by the clinic and leave me a urine, she will coordinate a ride.

## 2023-04-02 ENCOUNTER — Other Ambulatory Visit (HOSPITAL_COMMUNITY): Payer: Self-pay

## 2023-04-02 ENCOUNTER — Ambulatory Visit: Payer: MEDICAID | Admitting: Family Medicine

## 2023-04-02 ENCOUNTER — Encounter: Payer: Self-pay | Admitting: Family Medicine

## 2023-04-02 ENCOUNTER — Other Ambulatory Visit: Payer: Self-pay

## 2023-04-02 VITALS — BP 117/83 | HR 126 | Wt 136.0 lb

## 2023-04-02 DIAGNOSIS — Z30431 Encounter for routine checking of intrauterine contraceptive device: Secondary | ICD-10-CM | POA: Diagnosis not present

## 2023-04-02 DIAGNOSIS — Z3202 Encounter for pregnancy test, result negative: Secondary | ICD-10-CM

## 2023-04-02 DIAGNOSIS — B182 Chronic viral hepatitis C: Secondary | ICD-10-CM

## 2023-04-02 DIAGNOSIS — F1190 Opioid use, unspecified, uncomplicated: Secondary | ICD-10-CM | POA: Diagnosis not present

## 2023-04-02 LAB — POCT PREGNANCY, URINE: Preg Test, Ur: NEGATIVE

## 2023-04-02 MED ORDER — BUPRENORPHINE HCL-NALOXONE HCL 2-0.5 MG SL FILM
3.0000 | ORAL_FILM | Freq: Two times a day (BID) | SUBLINGUAL | 0 refills | Status: AC
  Filled 2023-04-02: qty 20, 8d supply, fill #0
  Filled 2023-04-12: qty 20, 7d supply, fill #0

## 2023-04-02 MED ORDER — NALOXONE HCL 4 MG/0.1ML NA LIQD
NASAL | 11 refills | Status: AC
  Filled 2023-04-02: qty 2, 2d supply, fill #0

## 2023-04-02 NOTE — Progress Notes (Signed)
   Subjective:   Rita Trujillo is a 28 y.o. [phone] here today for birth control, hx of OUD and management.    Health Maintenance Due  Topic Date Due   COVID-19 Vaccine (1) Never done   Pneumococcal Vaccine 49-93 Years old (1 of 2 - PCV) Never done   HPV VACCINES (1 - 3-dose series) Never done   INFLUENZA VACCINE  Never done    Past Medical History:  Diagnosis Date   Hepatitis C     No past surgical history on file.  The following portions of the patient's history were reviewed and updated as appropriate: allergies, current medications, past family history, past medical history, past social history, past surgical history and problem list.     Objective:   Rita Trujillo is well appearing and well nourished from previous visit in November 2024 at [facility].  She has completed incarceration at [facility] followed by 4 days at [facility] until her placement was terminated for rule violation.  Rita Trujillo was in her care at [facility] but is now in the care of DSS, placed with paternal grandmother. Unable to assess this substance exposed [place] at present.    Assessment and Plan:   Problem List Items Addressed This Visit   None   Routine preventative health maintenance measures emphasized. Please refer to After Visit Summary for other counseling recommendations.   No follow-ups on file.    Total face-to-face time with patient: 10 minutes.  Over 50% of encounter was spent on counseling and coordination of care.   Rita Trujillo, NNP-BC Neonatal Nurse Practitioner Substance Exposed Newborn Consult at the [facility] [phone]

## 2023-04-02 NOTE — Progress Notes (Signed)
 Rita Trujillo is a 28 y.o. [phone] here today for multiple issues.  Last seen on 01/18/2023 for postpartum visit, was incarcerated at that time At that time thinking about an IUD, was stable on suboxone  8 BID, and was referred for chronic HCV eval and treatment. Plan was to follow up once she was released I last heard from her on 03/13/23 by phone, when she requested a bridge rx after being released, reported she was going to [facility] program the following day  Today reports she went to [facility] but was dismissed from program after four days because her FOB came to visit when she wasn't supposed to have visitors for 30 days Now going through difficulties with DSS, kids are not in her custody Trying to get into treatment so that she can get her kids back She is interested in microdosing When she went to [facility] she was started on suboxone  and had precipitated withdrawal that was horrendous A friend did microdosing and had a good experience Has been injecting, never using alone, not sharing needles Needs some clean needles and a new sharps box  Does not want depo or Nexplanon Thinking about IUD but worried about pain Last had unprotected sex about three days ago No period since delivery, she reports this is normal when using opioids     Health Maintenance Due  Topic Date Due   COVID-19 Vaccine (1) Never done   Pneumococcal Vaccine 82-97 Years old (1 of 2 - PCV) Never done   HPV VACCINES (1 - 3-dose series) Never done   INFLUENZA VACCINE  Never done    Past Medical History:  Diagnosis Date   Hepatitis C     History reviewed. No pertinent surgical history.  The following portions of the patient's history were reviewed and updated as appropriate: allergies, current medications, past family history, past medical history, past social history, past surgical history and problem list.   Health Maintenance:   Last pap: No results found for: DIAGPAP, HPV,  HPVHIGH 06/10/2023 - NILM, due in 2 months  Last mammogram:  N/a    Review of Systems:  Pertinent items noted in HPI and remainder of comprehensive ROS otherwise negative.  Physical Exam:  BP 117/83   Pulse (!) 126   Wt 136 lb (61.7 kg)   Breastfeeding No   BMI 22.63 kg/m  CONSTITUTIONAL: Well-developed, well-nourished female in no acute distress.  HEENT:  Normocephalic, atraumatic. External right and left ear normal. No scleral icterus.  NECK: Normal range of motion, supple, no masses noted on observation SKIN: No rash noted. Not diaphoretic. No erythema. No pallor. MUSCULOSKELETAL: Normal range of motion. No edema noted. NEUROLOGIC: Alert and oriented to person, place, and time. Normal muscle tone coordination.  PSYCHIATRIC: Normal mood and affect. Normal behavior. Normal judgment and thought content. RESPIRATORY: Effort normal, no problems with respiration noted  Labs and Imaging Results for orders placed or performed in visit on 04/02/23 (from the past week)  Pregnancy, urine POC   Collection Time: 04/02/23  2:35 PM  Result Value Ref Range   Preg Test, Ur NEGATIVE NEGATIVE   No results found.    Assessment and Plan:   Problem List Items Addressed This Visit       Digestive   Chronic hepatitis C (HCC)   Not addressed at this visit, will see if we can do baseline labs and refer to ID at next visit.        Other   Encounter  for management of intrauterine contraceptive device (IUD)   Plan for IUD insertion at next visit, instructed no unprotected sex until then, given supply of condoms.       Opioid use disorder - Primary   Patient currently meeting criteria for moderate opioid use disorder. Strongly motivated to transition onto MAT. Has done a great deal of reading and is already very familiar with micro-induction. Still reviewed traditional/macro induction so that she is aware of options and offered to do this in office but she declines. Also discussed methadone   but she is not interested. Given detailed written instructions on micro induction and rx sent to pharmacy. Discussed harm reduction and narcan  sent to pharmacy. Will see back in one week to reassess.       Relevant Medications   naloxone  (NARCAN ) nasal spray 4 mg/0.1 mL   Buprenorphine  HCl-Naloxone  HCl 2-0.5 MG FILM   Other Relevant Orders   ToxAssure Flex 15, Ur    Routine preventative health maintenance measures emphasized. Please refer to After Visit Summary for other counseling recommendations.   Return in about 1 week (around 04/09/2023) for REACH clinic, IUD insertion.    Total face-to-face time with patient: 30 minutes.  Over 50% of encounter was spent on counseling and coordination of care.   Donnice CHRISTELLA Carolus, MD/MPH Attending Family Medicine Physician, [facility] for [facility], [facility]

## 2023-04-02 NOTE — Assessment & Plan Note (Signed)
Plan for IUD insertion at next visit, instructed no unprotected sex until then, given supply of condoms.

## 2023-04-02 NOTE — Assessment & Plan Note (Signed)
Not addressed at this visit, will see if we can do baseline labs and refer to ID at next visit.

## 2023-04-02 NOTE — Patient Instructions (Signed)
 Suboxone  Micro-Induction Guide  Micro-inductions of Suboxone  are used to avoid the problem of precipitated withdrawal that occurs with a traditional induction. Precipitated withdrawal means that you experience immediate withdrawal when you take Suboxone  instead of feeling better. This happens because you still have other opioids left in your system. In the past, patients could not use opioids for around 12-24 hours and be able to take Suboxone  without causing precipitated withdrawal. Unfortunately in the Fentanyl  era, despite going sometimes for 2-3 days without using opioids and feeling intense withdrawal symptoms, many patients still experience precipitated withdrawal when first starting on Suboxone . This is because of the unique chemical structure of fentanyl  which allows it to be stored in human fat cells and slowly leeched into the body.   Micro-inductions are designed to avoid this problem by starting at very low doses and slowly increasing each day, which allows your body time to adjust to the Suboxone .  A micro-induction is done over the course of one week using 2 mg Suboxone  films. I will prescribe the films today. Pick them up when they are ready at the pharmacy and bring your prescription here, and we will go over how to take the medicine together.   The following is the schedule of a standard micro-induction, where the dose is essentially doubled each day:  Day 1 Take 0.5 mg of Suboxone  once in the morning.  This is equivalent to 1/4 of one film.  Total daily dose: 0.5 mg  0.5 mg 0.5 mg 0.5 mg 0.5 mg  Once in the morning  Day 2  Take 0.5 mg of Suboxone  once in the morning, and once in the evening.  This is equivalent to 1/4 of one film in the morning, and 1/4 of one film in the evening.  Total daily dose: 1 mg  0.5 mg 0.5 mg 0.5 mg 0.5 mg  Once in the morning  0.5 mg 0.5 mg 0.5 mg 0.5 mg  Once in the evening  Day 3  Take 1 mg of Suboxone , once in the morning, and once in the  evening.  This is equivalent to 1/2 of one film in the morning, and 1/2 of one film in the evening. Total daily dose: 2 mg  1 mg 1 mg  Once in the morning  1 mg 1 mg  Once in the evening  Day 4 Take 2 mg of Suboxone , once in the morning, and once at night.  This is equivalent to 1 film in the morning, and 1 film in the evening. Total daily dose: 4 mg  2 mg  Once in the morning  2 mg  Once in the evening  Day 5 Take 3 mg of Suboxone , once in the morning, and once at night.  This is equivalent to 1 and 1/2 films in the morning, and 1 and 1/2 films in the evening. Total daily dose: 6 mg  2 mg   1 mg 1 mg  Once in the morning  2 mg   1 mg 1 mg  Once in the evening  Day 6 Take 4 mg of Suboxone , once in the morning, and once at night.  This is equivalent to 2 films in the morning, and 2 films in the evening. Total daily dose: 8 mg  2 mg   2 mg  Once in the morning  2 mg   2 mg  Once in the evening  Day 7 Take 6 mg of Suboxone , once in the morning, and once at  night.  This is equivalent to 3 films in the morning, and 3 films in the evening. Total daily dose: 12 mg Return to the office for a check-in.  2 mg   2 mg   2 mg  Once in the morning  2 mg   2 mg   2 mg  Once in the evening   While you are doing your micro-induction, make sure to do the following: Start at the initial prescribed dose. Starting at a higher dose will likely cause precipitated withdrawal. Do not stop your induction mid-way, and then try to resume a few days later at the same dose. This will cause precipitated withdrawal because you have not allowed your body time to adjust to the Suboxone  starting at the initial low dose. You may continue to use opioids during your induction as needed to treat symptoms of withdrawal. Do not quit cold turkey when you start your induction. Once you have gotten to a therapeutic dose of 12-16 mg you can stop using other opioids. If you feel sleepy  or excessively sedated, stop increasing your dose and call my office.

## 2023-04-02 NOTE — Assessment & Plan Note (Addendum)
 Patient currently meeting criteria for moderate opioid use disorder. Strongly motivated to transition onto MAT. Has done a great deal of reading and is already very familiar with micro-induction. Still reviewed traditional/macro induction so that she is aware of options and offered to do this in office but she declines. Also discussed methadone  but she is not interested. Given detailed written instructions on micro induction and rx sent to pharmacy. Discussed harm reduction and narcan  sent to pharmacy. Will see back in one week to reassess.

## 2023-04-05 LAB — TOXASSURE FLEX 15, UR
6-ACETYLMORPHINE IA: NEGATIVE ng/mL
7-aminoclonazepam: NOT DETECTED ng/mg{creat}
Alpha-hydroxyalprazolam: NOT DETECTED ng/mg{creat}
Alpha-hydroxymidazolam: NOT DETECTED ng/mg{creat}
Alpha-hydroxytriazolam: NOT DETECTED ng/mg{creat}
Alprazolam: NOT DETECTED ng/mg{creat}
BARBITURATES IA: NEGATIVE ng/mL
BUPRENORPHINE: NEGATIVE
Benzodiazepines: NEGATIVE
Buprenorphine: NOT DETECTED ng/mg{creat}
COCAINE METABOLITE IA: NEGATIVE ng/mL
Clonazepam: NOT DETECTED ng/mg{creat}
Creatinine: 245 mg/dL
Desalkylflurazepam: NOT DETECTED ng/mg{creat}
Desmethyldiazepam: NOT DETECTED ng/mg{creat}
Desmethylflunitrazepam: NOT DETECTED ng/mg{creat}
Diazepam: NOT DETECTED ng/mg{creat}
ETHYL ALCOHOL Enzymatic: NEGATIVE g/dL
FENTANYL: POSITIVE
Fentanyl: >204 ng/mg{creat}
Flunitrazepam: NOT DETECTED ng/mg{creat}
Lorazepam: NOT DETECTED ng/mg{creat}
METHADONE IA: NEGATIVE ng/mL
METHADONE MTB IA: NEGATIVE ng/mL
Midazolam: NOT DETECTED ng/mg{creat}
Norbuprenorphine: NOT DETECTED ng/mg{creat}
Norfentanyl: >408 ng/mg{creat}
OXYCODONE CLASS IA: NEGATIVE ng/mL
Oxazepam: NOT DETECTED ng/mg{creat}
PHENCYCLIDINE IA: NEGATIVE ng/mL
TAPENTADOL, IA: NEGATIVE ng/mL
TRAMADOL IA: NEGATIVE ng/mL
Temazepam: NOT DETECTED ng/mg{creat}

## 2023-04-05 LAB — OPIATE CLASS, MS, UR RFX
Codeine: NOT DETECTED ng/mg{creat}
Dihydrocodeine: NOT DETECTED ng/mg{creat}
Hydrocodone: NOT DETECTED ng/mg{creat}
Hydromorphone: NOT DETECTED ng/mg{creat}
Morphine: 144 ng/mg{creat}
Norcodeine: NOT DETECTED ng/mg{creat}
Norhydrocodone: NOT DETECTED ng/mg{creat}
Normorphine: NOT DETECTED ng/mg{creat}
Opiate Class Confirmation: POSITIVE

## 2023-04-05 LAB — AMPHETAMINES, MS, UR RFX
Amphetamine: 2417 ng/mg{creat}
Amphetamines Confirmation: POSITIVE
MDA (Ecstasy metabolite): NOT DETECTED ng/mg{creat}
MDMA (Ecstasy): NOT DETECTED ng/mg{creat}
Methamphetamine: >2041 ng/mg{creat}

## 2023-04-05 LAB — CANNABINOIDS, MS, UR RFX
Cannabinoids Confirmation: POSITIVE
Carboxy-THC: 27 ng/mg{creat}

## 2023-04-09 ENCOUNTER — Ambulatory Visit: Payer: MEDICAID | Admitting: Family Medicine

## 2023-04-12 ENCOUNTER — Other Ambulatory Visit (HOSPITAL_COMMUNITY): Payer: Self-pay

## 2023-05-21 ENCOUNTER — Telehealth: Payer: Self-pay | Admitting: Family Medicine

## 2023-05-21 ENCOUNTER — Other Ambulatory Visit: Payer: Self-pay | Admitting: Family Medicine

## 2023-05-21 DIAGNOSIS — F1190 Opioid use, unspecified, uncomplicated: Secondary | ICD-10-CM

## 2023-05-21 NOTE — Telephone Encounter (Signed)
 Patient sent a mychart message requesting suboxone refill.

## 2023-05-24 ENCOUNTER — Encounter: Payer: MEDICAID | Admitting: Family Medicine

## 2023-05-28 ENCOUNTER — Encounter: Payer: Self-pay | Admitting: *Deleted

## 2023-05-28 NOTE — Progress Notes (Unsigned)
 Pt attended 11/21/22 screening event where her b/p was 127/83 and her blood sugar was 92. At the event, the pt listed her OB-GYN [facility] as her PCP and did not identify any SDOH insecurities. In addition, chart review indicates pt delivered a baby boy on 12/23/22 and HIPAA restrictions apply to pt's current post-partum SDOH. Pt did come to 3 week post partum check up by delivering OB provider from [facility], Dr. Crissie Reese on 01/18/23 (no vitals noted in that visit) . Chart review at the 6 month f/u indicates pt continues to receive care from Dr. Crissie Reese but does not reveal any [facility]-visible PCP encounters. PCP info in [facility] and [facility] flyers mailed to pt to last known [facility] address of record, in case she still has not established Primary care. Since pt has [facility] and listed transportation as a need, Civil engineer, contracting also mailed to pt. No additional health equity team support scheduled at this time.

## 2023-06-12 ENCOUNTER — Other Ambulatory Visit (HOSPITAL_COMMUNITY): Payer: Self-pay

## 2023-10-18 ENCOUNTER — Other Ambulatory Visit: Payer: Self-pay

## 2023-10-18 ENCOUNTER — Emergency Department (HOSPITAL_BASED_OUTPATIENT_CLINIC_OR_DEPARTMENT_OTHER): Payer: MEDICAID

## 2023-10-18 ENCOUNTER — Emergency Department (HOSPITAL_BASED_OUTPATIENT_CLINIC_OR_DEPARTMENT_OTHER)
Admission: EM | Admit: 2023-10-18 | Discharge: 2023-10-18 | Disposition: A | Discharge status: Home / Self Care | Payer: MEDICAID | Attending: Emergency Medicine | Admitting: Emergency Medicine

## 2023-10-18 ENCOUNTER — Encounter (HOSPITAL_BASED_OUTPATIENT_CLINIC_OR_DEPARTMENT_OTHER): Payer: Self-pay | Admitting: Emergency Medicine

## 2023-10-18 DIAGNOSIS — R519 Headache, unspecified: Secondary | ICD-10-CM | POA: Diagnosis not present

## 2023-10-18 DIAGNOSIS — S199XXA Unspecified injury of neck, initial encounter: Secondary | ICD-10-CM | POA: Diagnosis present

## 2023-10-18 DIAGNOSIS — S161XXA Strain of muscle, fascia and tendon at neck level, initial encounter: Secondary | ICD-10-CM | POA: Diagnosis not present

## 2023-10-18 DIAGNOSIS — N611 Abscess of the breast and nipple: Secondary | ICD-10-CM | POA: Diagnosis not present

## 2023-10-18 DIAGNOSIS — Y92481 Parking lot as the place of occurrence of the external cause: Secondary | ICD-10-CM | POA: Diagnosis not present

## 2023-10-18 HISTORY — DX: Opioid use, unspecified, uncomplicated: F11.90

## 2023-10-18 LAB — CBC WITH DIFFERENTIAL/PLATELET
Abs Immature Granulocytes: 0.02 K/uL (ref 0.00–0.07)
Basophils Absolute: 0 K/uL (ref 0.0–0.1)
Basophils Relative: 0 %
Eosinophils Absolute: 0.1 K/uL (ref 0.0–0.5)
Eosinophils Relative: 1 %
HCT: 35.3 % — ABNORMAL LOW (ref 36.0–46.0)
Hemoglobin: 12 g/dL (ref 12.0–15.0)
Immature Granulocytes: 0 %
Lymphocytes Relative: 18 %
Lymphs Abs: 1.5 K/uL (ref 0.7–4.0)
MCH: 27.2 pg (ref 26.0–34.0)
MCHC: 34 g/dL (ref 30.0–36.0)
MCV: 80 fL (ref 80.0–100.0)
Monocytes Absolute: 0.5 K/uL (ref 0.1–1.0)
Monocytes Relative: 6 %
Neutro Abs: 6.3 K/uL (ref 1.7–7.7)
Neutrophils Relative %: 75 %
Platelets: 231 K/uL (ref 150–400)
RBC: 4.41 MIL/uL (ref 3.87–5.11)
RDW: 13.8 % (ref 11.5–15.5)
WBC: 8.3 K/uL (ref 4.0–10.5)
nRBC: 0 % (ref 0.0–0.2)

## 2023-10-18 LAB — COMPREHENSIVE METABOLIC PANEL WITH GFR
ALT: 120 U/L — ABNORMAL HIGH (ref 0–44)
AST: 127 U/L — ABNORMAL HIGH (ref 15–41)
Albumin: 3.9 g/dL (ref 3.5–5.0)
Alkaline Phosphatase: 101 U/L (ref 38–126)
Anion gap: 9 (ref 5–15)
BUN: 6 mg/dL (ref 6–20)
CO2: 28 mmol/L (ref 22–32)
Calcium: 9.1 mg/dL (ref 8.9–10.3)
Chloride: 101 mmol/L (ref 98–111)
Creatinine, Ser: 0.56 mg/dL (ref 0.44–1.00)
GFR, Estimated: >60 mL/min (ref >60)
Glucose, Bld: 92 mg/dL (ref 70–99)
Potassium: 3.4 mmol/L — ABNORMAL LOW (ref 3.5–5.1)
Sodium: 138 mmol/L (ref 135–145)
Total Bilirubin: 0.6 mg/dL (ref 0.0–1.2)
Total Protein: 6.8 g/dL (ref 6.5–8.1)

## 2023-10-18 LAB — LACTIC ACID, PLASMA: Lactic Acid, Venous: 0.6 mmol/L (ref 0.5–1.9)

## 2023-10-18 LAB — HCG, SERUM, QUALITATIVE: Preg, Serum: NEGATIVE

## 2023-10-18 MED ORDER — SODIUM CHLORIDE 0.9 % IV BOLUS
1000.0000 mL | Freq: Once | INTRAVENOUS | Status: AC
  Administered 2023-10-18: 1000 mL via INTRAVENOUS

## 2023-10-18 MED ORDER — DIPHENHYDRAMINE HCL 25 MG PO CAPS
25.0000 mg | ORAL_CAPSULE | Freq: Once | ORAL | Status: AC
  Administered 2023-10-18: 25 mg via ORAL
  Filled 2023-10-18: qty 1

## 2023-10-18 MED ORDER — SULFAMETHOXAZOLE-TRIMETHOPRIM 800-160 MG PO TABS: 1.0000 | ORAL_TABLET | Freq: Two times a day (BID) | ORAL | 0 refills | Status: AC

## 2023-10-18 MED ORDER — VANCOMYCIN HCL IN DEXTROSE 1-5 GM/200ML-% IV SOLN
1000.0000 mg | Freq: Once | INTRAVENOUS | Status: AC
  Administered 2023-10-18: 1000 mg via INTRAVENOUS
  Filled 2023-10-18: qty 200
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
## 2023-10-18 NOTE — ED Notes (Addendum)
 Pt complaining of head itching and burning. Vancomycin  stopped. EDP notified vanc stopped. New order for Benadryl  25 mg po. No rash noted on body or scalp
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
## 2023-10-18 NOTE — ED Notes (Signed)
 Attempted with butterfly needle to obtain blood for labs x 2, unsuccessful, tol well. Multiple areas with scar tissue noted

## 2023-10-18 NOTE — ED Notes (Signed)
 Patient transported to CT

## 2023-10-18 NOTE — ED Notes (Signed)
 Reviewed discharge instructions, follow up and medications with pt. Pt states understanding. Ambulatory at discharge with so

## 2023-10-18 NOTE — ED Notes (Signed)
 IV fluids and antibiotics paused.  Patient says she is start to itch.  RN notified.
